# Patient Record
Sex: Male | Born: 1950
Health system: Southern US, Community
[De-identification: ages and names within clinical notes are randomized; demographics above are authoritative.]

## PROBLEM LIST (undated history)

## (undated) DIAGNOSIS — M1990 Unspecified osteoarthritis, unspecified site: Secondary | ICD-10-CM

## (undated) DIAGNOSIS — R0600 Dyspnea, unspecified: Secondary | ICD-10-CM

## (undated) DIAGNOSIS — I10 Essential (primary) hypertension: Secondary | ICD-10-CM

## (undated) DIAGNOSIS — T4145XA Adverse effect of unspecified anesthetic, initial encounter: Secondary | ICD-10-CM

## (undated) DIAGNOSIS — E785 Hyperlipidemia, unspecified: Secondary | ICD-10-CM

## (undated) DIAGNOSIS — N400 Enlarged prostate without lower urinary tract symptoms: Secondary | ICD-10-CM

## (undated) DIAGNOSIS — T8859XA Other complications of anesthesia, initial encounter: Secondary | ICD-10-CM

## (undated) DIAGNOSIS — C801 Malignant (primary) neoplasm, unspecified: Secondary | ICD-10-CM

## (undated) HISTORY — PX: RETINAL DETACHMENT SURGERY: SHX105

## (undated) HISTORY — PX: EYE SURGERY: SHX253

## (undated) HISTORY — DX: Hyperlipidemia, unspecified: E78.5

## (undated) HISTORY — PX: VASECTOMY: SHX75

## (undated) HISTORY — DX: Malignant (primary) neoplasm, unspecified: C80.1

## (undated) HISTORY — DX: Unspecified osteoarthritis, unspecified site: M19.90

## (undated) HISTORY — PX: CATARACT EXTRACTION W/ INTRAOCULAR LENS  IMPLANT, BILATERAL: SHX1307

## (undated) HISTORY — DX: Essential (primary) hypertension: I10

## (undated) SURGERY — Surgical Case
Anesthesia: *Unknown

---

## 2004-08-07 DIAGNOSIS — C801 Malignant (primary) neoplasm, unspecified: Secondary | ICD-10-CM

## 2004-08-07 HISTORY — DX: Malignant (primary) neoplasm, unspecified: C80.1

## 2005-07-11 ENCOUNTER — Ambulatory Visit (HOSPITAL_COMMUNITY): Admission: RE | Admit: 2005-07-11 | Discharge: 2005-07-12 | Payer: Self-pay | Admitting: Ophthalmology

## 2006-08-07 HISTORY — PX: COLONOSCOPY: SHX174

## 2006-11-19 ENCOUNTER — Ambulatory Visit: Payer: Self-pay | Admitting: Unknown Physician Specialty

## 2006-11-19 LAB — HM COLONOSCOPY

## 2007-07-08 ENCOUNTER — Ambulatory Visit: Payer: Self-pay | Admitting: Family Medicine

## 2007-07-09 ENCOUNTER — Ambulatory Visit (HOSPITAL_COMMUNITY): Admission: RE | Admit: 2007-07-09 | Discharge: 2007-07-10 | Payer: Self-pay | Admitting: Ophthalmology

## 2008-05-29 ENCOUNTER — Ambulatory Visit: Payer: Self-pay | Admitting: Sports Medicine

## 2008-06-16 ENCOUNTER — Ambulatory Visit: Payer: Self-pay | Admitting: Family Medicine

## 2008-08-27 DIAGNOSIS — Z86018 Personal history of other benign neoplasm: Secondary | ICD-10-CM

## 2008-08-27 HISTORY — DX: Personal history of other benign neoplasm: Z86.018

## 2010-09-17 IMAGING — CR RIGHT ELBOW - COMPLETE 3+ VIEW
1 series · 4 of 4 positions shown · non-contrast
Comparison: none

REASON FOR EXAM: Pain
COMMENTS:

PROCEDURE:     KDR - KDXR ELBOW RT COMP W/OBLIQUES  - June 16, 2008 [DATE]
RESULT:     No fracture, dislocation or other acute bony abnormality is
identified. No erosive arthritic changes are identified. No soft tissue
calcification is seen.

[Series 2: view not recorded · 0.17mm/px · 4 of 4 slices shown]
[im 1/4]
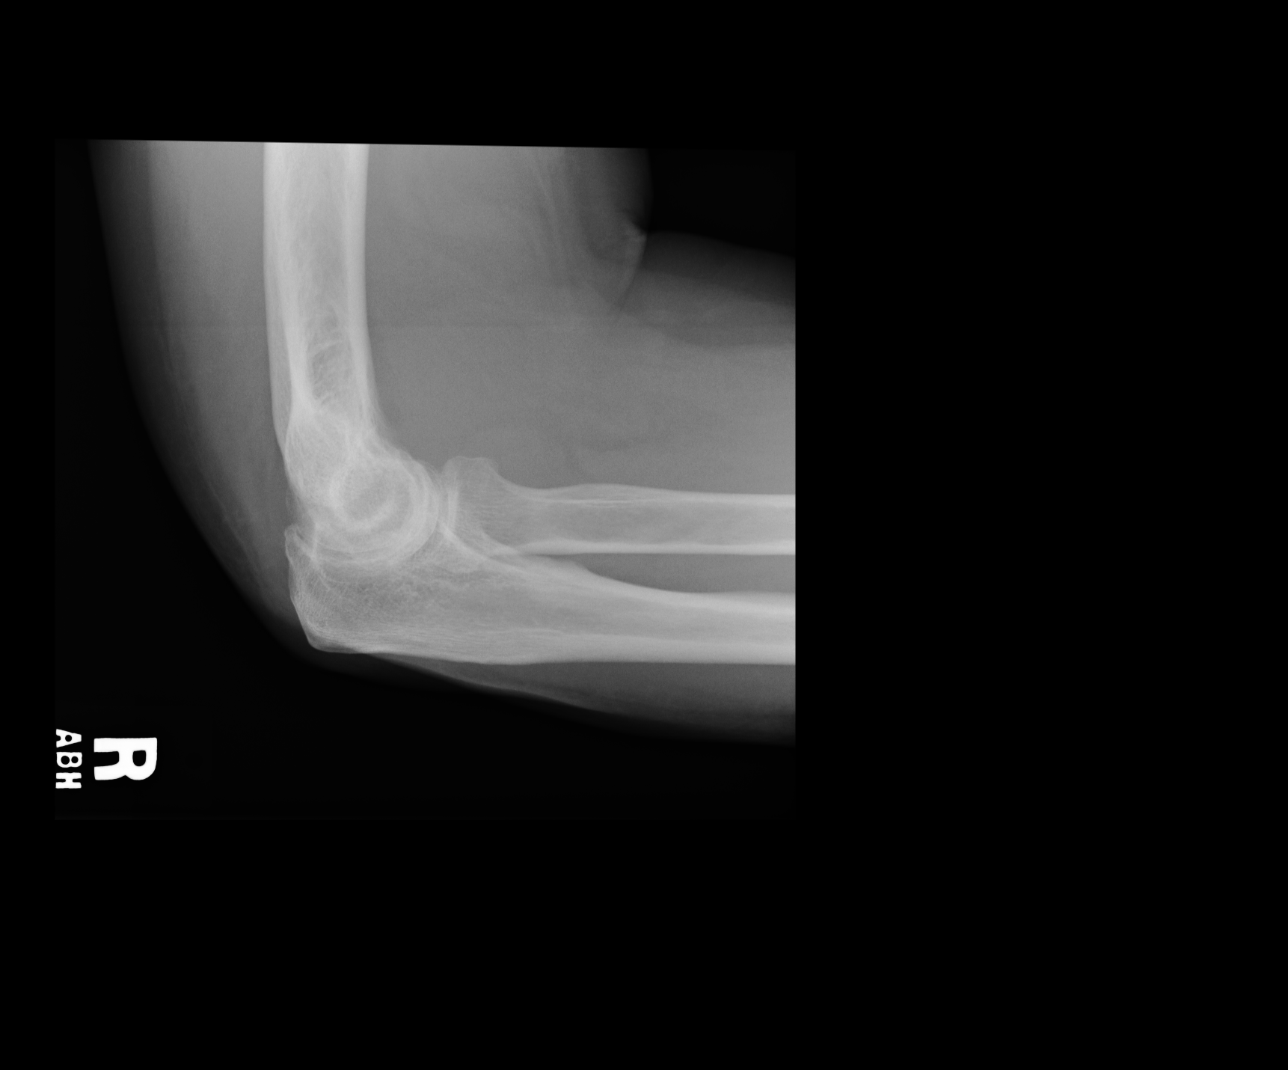
[im 2/4]
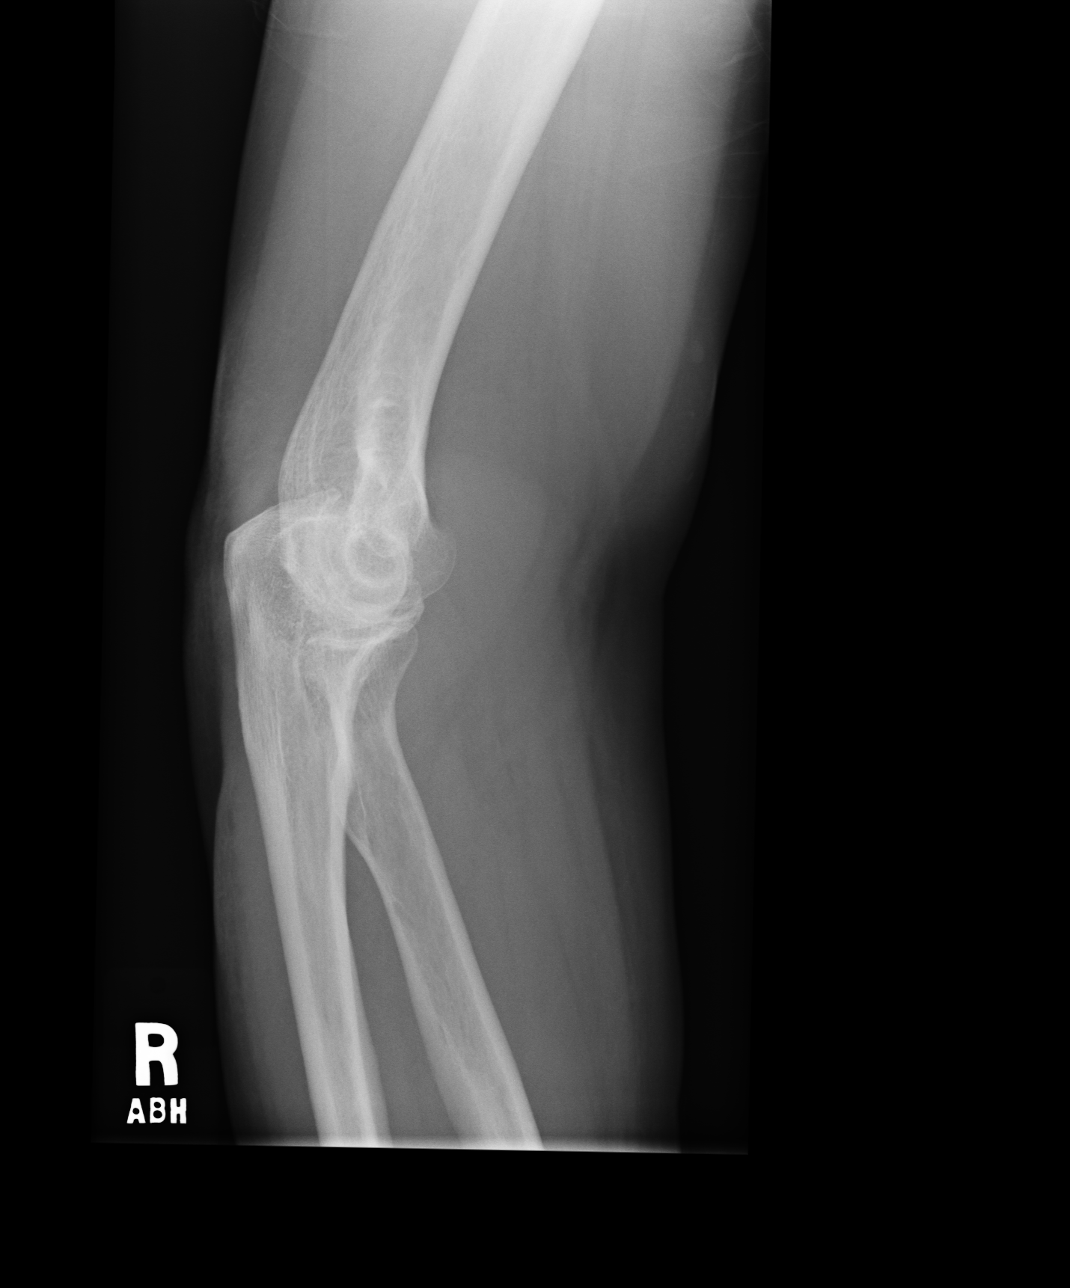
[im 3/4]
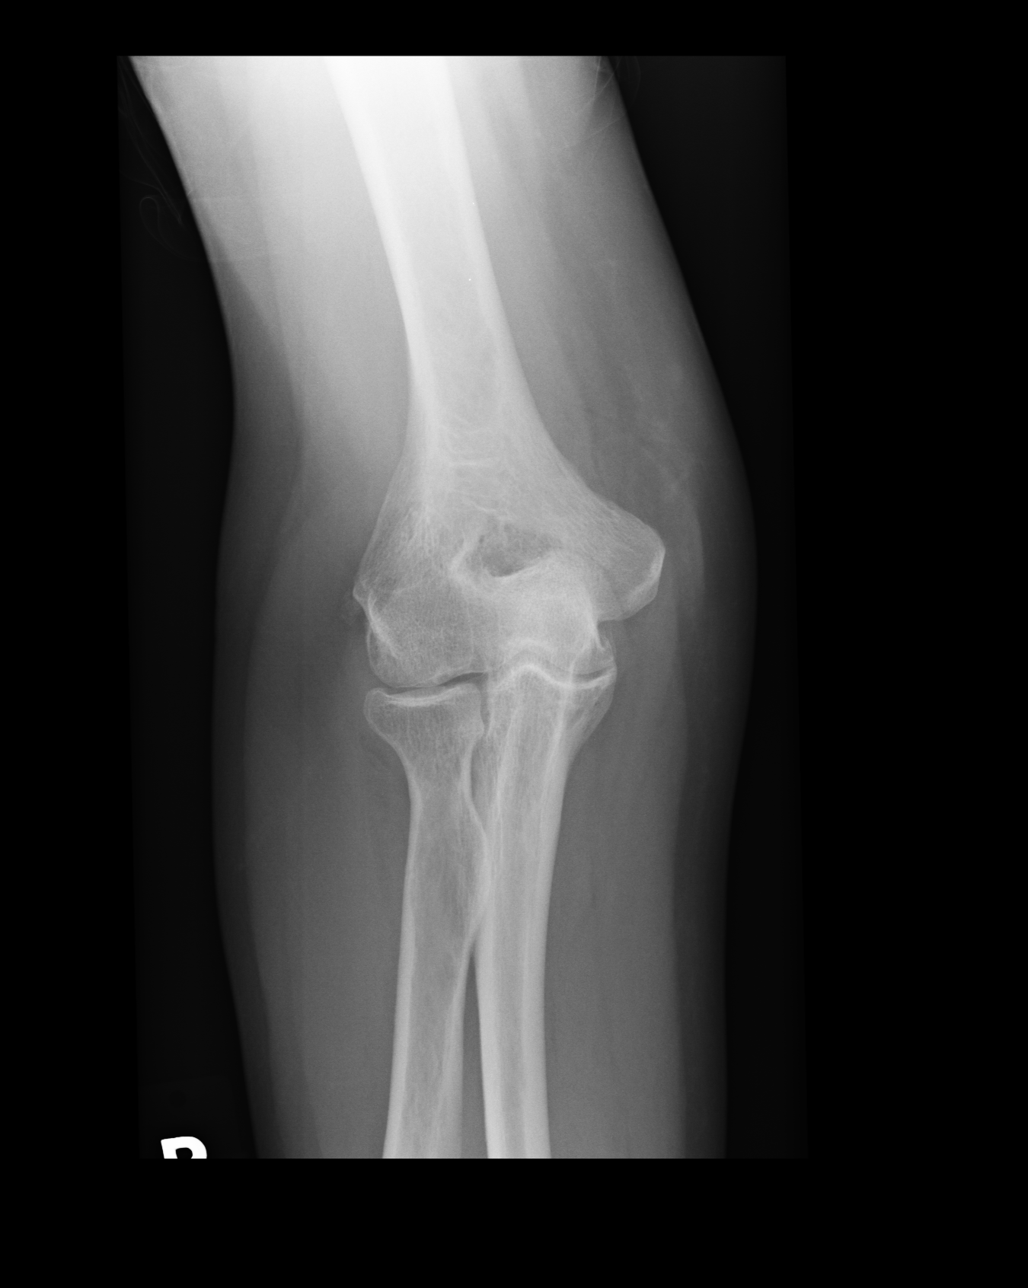
[im 4/4]
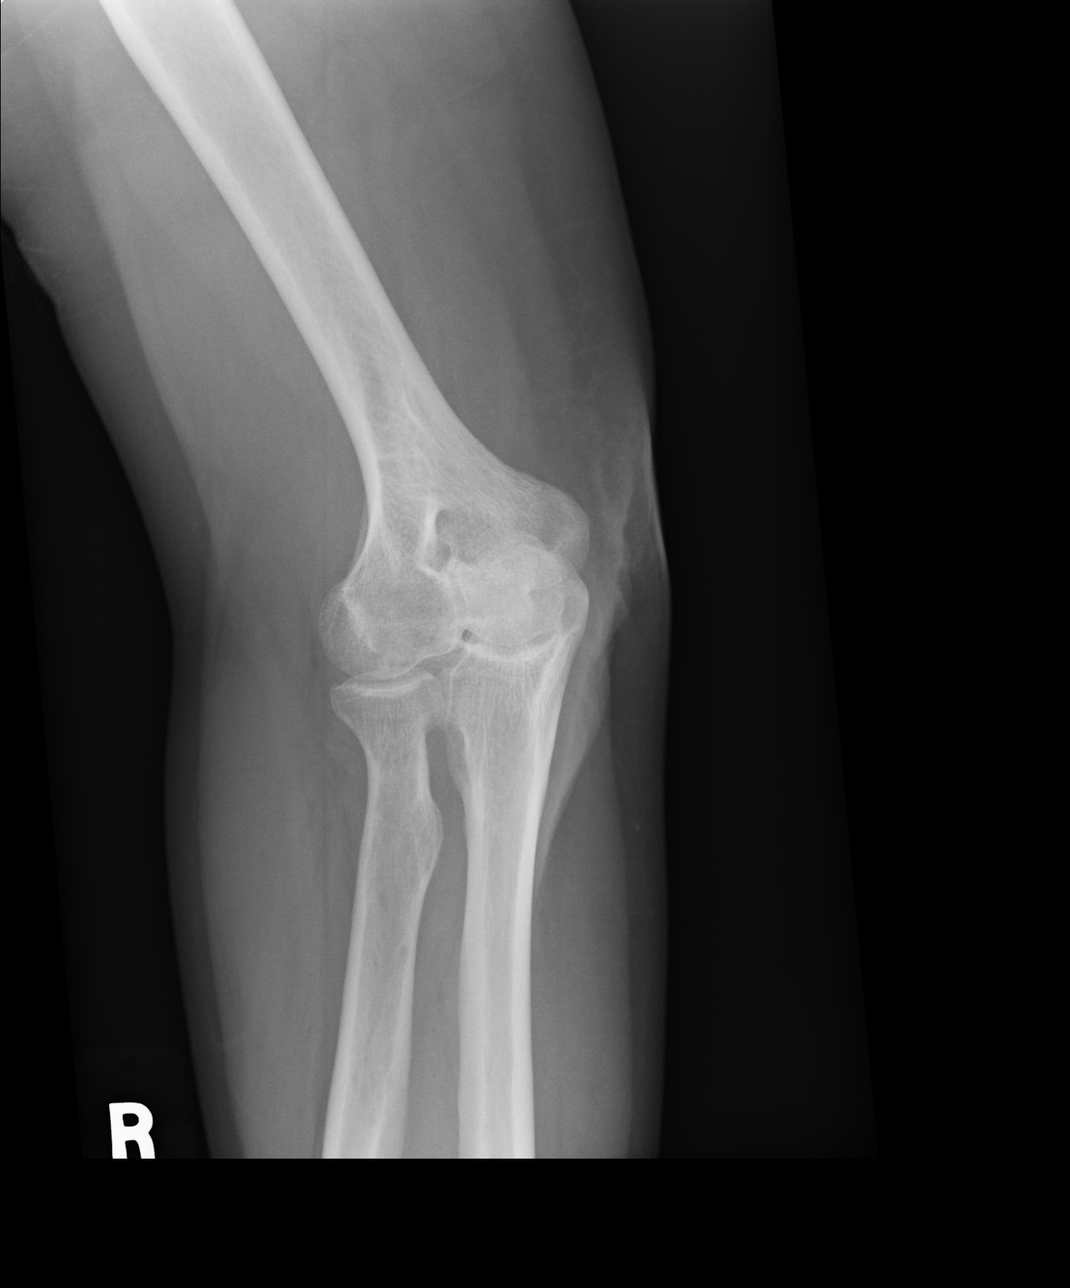

[4 of 4 positions shown; findings below may reference images not displayed]

IMPRESSION: No significant abnormalities are identified on plain film
examination.

## 2010-12-20 NOTE — Op Note (Signed)
NAME:  Wayne Goodwin, Wayne Goodwin                 ACCOUNT NO.:  [id number]   MEDICAL RECORD NO.:  [id number]          PATIENT TYPE:  AMB   LOCATION:  SDS                          FACILITY:  [facility]   PHYSICIAN:  John D. Ashley Royalty, M.D. DATE OF BIRTH:  [birth year]   DATE OF PROCEDURE:  07/09/2007  DATE OF DISCHARGE:                               OPERATIVE REPORT   ADMISSION DIAGNOSIS:  Rhegmatogenous retinal detachment, left eye.   PROCEDURE:  Scleral buckle left eye, gas injection left eye, retinal  photocoagulation left eye.   SURGEON:  Joshue Mulder, M.D.   ASSISTANT:  Bryan Lemma. Lundquist, P.A.   ANESTHESIA:  General.   DETAILS:  Usual prep and drape, 360 degrees limbal peritomy, isolation  of four rectus muscles on 2-0 silk.  Localization of break at 3 o'clock.  Scleral dissection for 360 degrees to admit number 279 intrascleral  implant.  Diathermy placed in the bed.  The radial 508G segment was  placed at 3 o'clock beneath the break.  240 band placed around the eye  with a 270 sleeve at 10 o'clock.  The perforation site was chosen at 3  o'clock in posterior aspect of the bed.  A slow controlled fluid egress  occurred.  The fluid was clear and colorless and thin.  The Perfluoron  propane 1 mL 100% was injected in the vitreous cavity rate to reinflate  the globe.  Two scleral sutures per quadrant for a total of eight  scleral sutures were placed in the scleral flaps.  The 508G segment was  placed beneath the break.  The buckle was trimmed and adjusted.  The  band was trimmed and adjusted.  Indirect ophthalmoscopy showed the  retina be lying nicely in place on the scleral buckle with the retinal  break well-supported.  The indirect ophthalmoscope laser was moved into  place, 1383 burns placed around the retinal periphery with a power of  700 milliwatts, 1000 microns each and 0.1 seconds each.  The scleral  sutures were knotted and the free ends removed.  The conjunctiva was  reapproximated with  suture.  Polymyxin and gentamicin were irrigated  into Tenon's space.  Atropine solution was applied.  Marcaine was  infiltrated injected around the globe for postop pain. Decadron 10 mg  was injected to the lower subconjunctival space.  Closing pressure was  15 with a Barraquer tonometer.  TobraDex ophthalmic ointment, patch and  shield were placed.  The patient was awakened, taken to recovery in  satisfactory condition.   COMPLICATIONS:  None.   DURATION:  2 hours.      Beulah Gandy. Ashley Royalty, M.D.  Electronically Signed     JDM/MEDQ  D:  07/09/2007  T:  07/09/2007  Job:  [id number]

## 2010-12-23 NOTE — Op Note (Signed)
NAME:  Pietrzak, Afnan                 ACCOUNT NO.:  [id number]   MEDICAL RECORD NO.:  [id number]          PATIENT TYPE:  AMB   LOCATION:  SDS                          FACILITY:  [facility]   PHYSICIAN:  John D. Ashley Royalty, M.D. DATE OF BIRTH:  May 22, 1951   DATE OF PROCEDURE:  07/11/2005  DATE OF DISCHARGE:                                 OPERATIVE REPORT   ADMISSION DIAGNOSIS:  Rhegmatogenous retinal detachment with multiple large  breaks, right eye.   PROCEDURE:  Scleral buckle, pars plana vitrectomy, retinal photocoagulation,  gas-fluid exchange, Perfluoron injection, perfluoropropane injection on the  right eye.   SURGEON:  Trevionne Mulder, MD   ASSISTANT:  Rosalie Doctor, MA   ANESTHESIA:  General.   DETAILS:  Usual prep and drape, 360-degree limbal peritomy, isolation of  four rectus muscles on 2-0 silk, localization of breaks superiorly and  inferiorly, scleral dissection for 360 degrees to admit a number 279  intrascleral implant.  Additional posterior dissection was carried out at 8  o'clock to admit a number 508G radial segment.  A 240 band was placed around  the eye with a 270 sleeve at 3 o'clock.  Once the buckle was placed, a  perforation was made at 2 o'clock and a large amount of clear colorless  subretinal fluid came forth.  The decision was made for a vitrectomy.  The 5-  mm infusion port was placed at 8 o'clock.  The lighted pick and the cutter  were placed at 10 and 2 o'clock, respectively.  The biome viewing system was  used and Provisc on the corneal surface.  Pars plana vitrectomy was begun  just behind the crystalline lens.  Vitreous blood, vitreous debris, vitreous  pigment and vitreous strands were encountered.  These were carefully removed  under low suction and rapid cutting for 360 degrees.  The break at 8 o'clock  was seen and the anterior flap was removed.  Several lattice patches with  tears in them were seen at 10 o'clock and 11 o'clock, and 2 large breaks  were seen in the 11, 11:30 and 1 o'clock.  Perfluoron was injected to  reattach the retina.  Endolaser was performed, once the Perfluoron was in  place.  Gas was exchanged for the Perfluoron and a full reattachment of the  peripheral retina was obtained with minimal balloon of subretinal fluid  superiorly in the posterior segment.  The indirect ophthalmoscope laser was  moved into place and laser marks were placed around the retinal periphery;  the total number was 1368 burns with a power of [id number] milliwatts, 1000  microns each and 0.1 seconds each.  A Perfluoropropane gas 16%/gas exchange  was performed.  The instruments were removed from the eye and 9-0 nylon was  used to close.  The sclerotomy sites were tested and found to be tight.  The  buckle was adjusted and trimmed.  The band was adjusted and trimmed.  Scleral sutures were knotted and the free ends removed.  The conjunctiva was  reposited with 7-0 chromic suture.  Polymyxin and gentamicin were irrigated  into Tenon's space.  Atropine solution was applied.  Marcaine was injected  around the globe for postop  pain.  TobraDex ophthalmic ointment, a patch and shield were placed.  The  closing pressure was 10 with a Barraquer keratometer.  Complications --  none.  Duration -- 2-1/2 hours.  TobraDex, a patch and shield were placed.  The patient was awakened and taken to Recovery in satisfactory condition.      Wayne Goodwin. Ashley Royalty, M.D.  Electronically Signed     JDM/MEDQ  D:  07/11/2005  T:  07/12/2005  Job:  [id number]

## 2011-05-15 LAB — CBC
HCT: 35.4 — ABNORMAL LOW
MCHC: 35.2
MCV: 92.9
Platelets: 224
WBC: 9.1

## 2011-05-15 LAB — BASIC METABOLIC PANEL
BUN: 29 — ABNORMAL HIGH
CO2: 24
Chloride: 102
Potassium: 4.2

## 2011-08-08 HISTORY — PX: COLONOSCOPY W/ BIOPSIES: SHX1374

## 2011-10-31 ENCOUNTER — Ambulatory Visit (INDEPENDENT_AMBULATORY_CARE_PROVIDER_SITE_OTHER): Payer: PRIVATE HEALTH INSURANCE | Admitting: Ophthalmology

## 2011-10-31 DIAGNOSIS — H35039 Hypertensive retinopathy, unspecified eye: Secondary | ICD-10-CM

## 2011-10-31 DIAGNOSIS — H2700 Aphakia, unspecified eye: Secondary | ICD-10-CM

## 2011-10-31 DIAGNOSIS — H43819 Vitreous degeneration, unspecified eye: Secondary | ICD-10-CM

## 2011-10-31 DIAGNOSIS — I10 Essential (primary) hypertension: Secondary | ICD-10-CM

## 2011-10-31 DIAGNOSIS — H33009 Unspecified retinal detachment with retinal break, unspecified eye: Secondary | ICD-10-CM

## 2011-11-14 ENCOUNTER — Ambulatory Visit: Payer: Self-pay | Admitting: Unknown Physician Specialty

## 2012-02-15 ENCOUNTER — Ambulatory Visit: Payer: Self-pay | Admitting: Unknown Physician Specialty

## 2012-10-30 ENCOUNTER — Ambulatory Visit (INDEPENDENT_AMBULATORY_CARE_PROVIDER_SITE_OTHER): Payer: PRIVATE HEALTH INSURANCE | Admitting: Ophthalmology

## 2012-12-06 ENCOUNTER — Ambulatory Visit (INDEPENDENT_AMBULATORY_CARE_PROVIDER_SITE_OTHER): Payer: 59 | Admitting: Ophthalmology

## 2012-12-06 DIAGNOSIS — I10 Essential (primary) hypertension: Secondary | ICD-10-CM

## 2012-12-06 DIAGNOSIS — H43819 Vitreous degeneration, unspecified eye: Secondary | ICD-10-CM

## 2012-12-06 DIAGNOSIS — H35379 Puckering of macula, unspecified eye: Secondary | ICD-10-CM

## 2012-12-06 DIAGNOSIS — H35039 Hypertensive retinopathy, unspecified eye: Secondary | ICD-10-CM

## 2012-12-06 DIAGNOSIS — H33009 Unspecified retinal detachment with retinal break, unspecified eye: Secondary | ICD-10-CM

## 2013-08-07 HISTORY — PX: INSERTION PROSTATE RADIATION SEED: SUR718

## 2013-11-14 DIAGNOSIS — R972 Elevated prostate specific antigen [PSA]: Secondary | ICD-10-CM | POA: Insufficient documentation

## 2013-11-14 DIAGNOSIS — N401 Enlarged prostate with lower urinary tract symptoms: Secondary | ICD-10-CM

## 2013-11-14 DIAGNOSIS — N138 Other obstructive and reflux uropathy: Secondary | ICD-10-CM | POA: Insufficient documentation

## 2013-12-10 ENCOUNTER — Ambulatory Visit (INDEPENDENT_AMBULATORY_CARE_PROVIDER_SITE_OTHER): Payer: 59 | Admitting: Ophthalmology

## 2013-12-10 DIAGNOSIS — H35349 Macular cyst, hole, or pseudohole, unspecified eye: Secondary | ICD-10-CM

## 2013-12-10 DIAGNOSIS — H43819 Vitreous degeneration, unspecified eye: Secondary | ICD-10-CM

## 2013-12-10 DIAGNOSIS — H33009 Unspecified retinal detachment with retinal break, unspecified eye: Secondary | ICD-10-CM

## 2013-12-10 DIAGNOSIS — H35039 Hypertensive retinopathy, unspecified eye: Secondary | ICD-10-CM

## 2013-12-10 DIAGNOSIS — I10 Essential (primary) hypertension: Secondary | ICD-10-CM

## 2014-01-30 DIAGNOSIS — E669 Obesity, unspecified: Secondary | ICD-10-CM | POA: Insufficient documentation

## 2014-02-14 IMAGING — US ABDOMEN ULTRASOUND LIMITED
1 series · 17 of 25 positions shown · non-contrast
Comparison: none

REASON FOR EXAM: elevated transaminase
COMMENTS:

[Series 1: abdomen ultrasound limited · 17 of 83 slices shown]
[im 1/83]
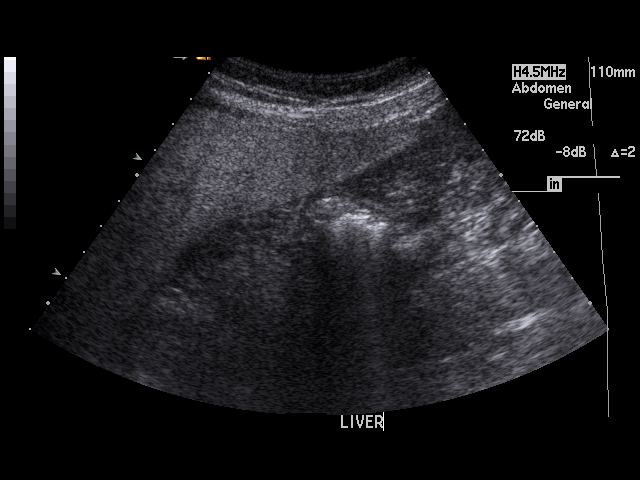
[im 7/83]
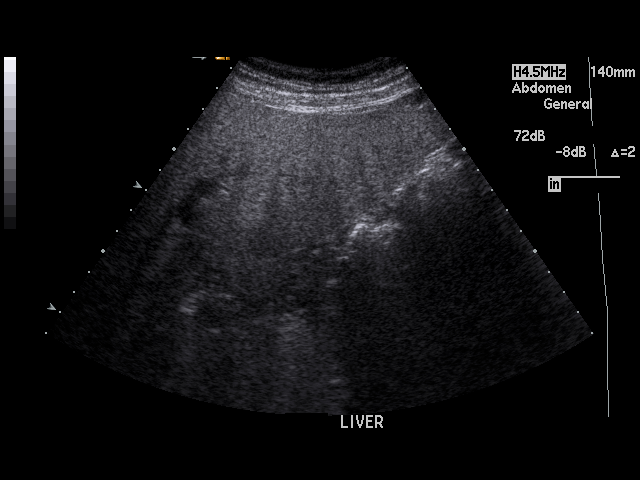
[im 11/83]
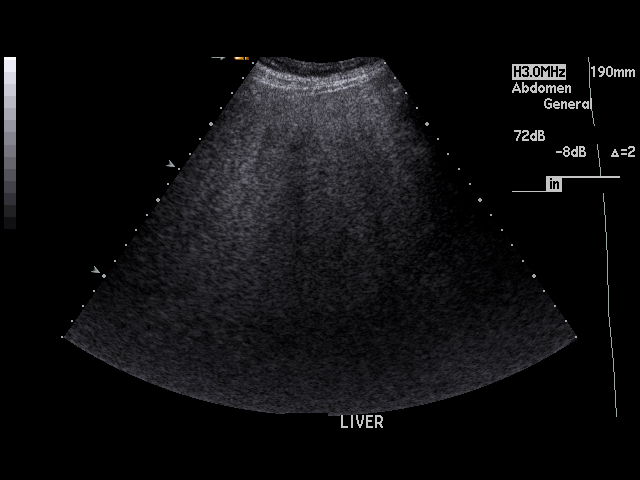
[im 18/83]
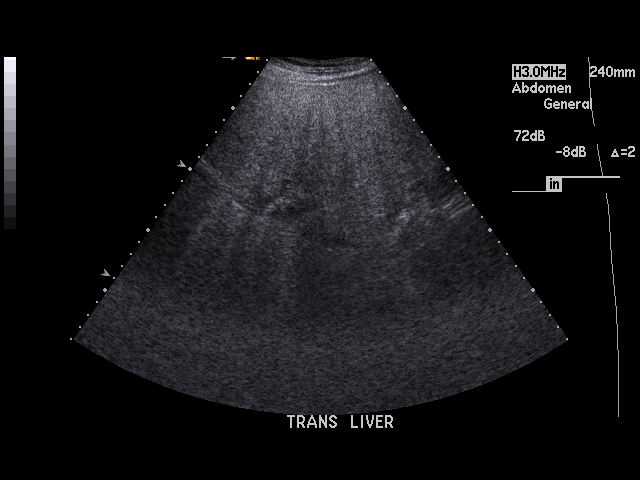
[im 21/83]
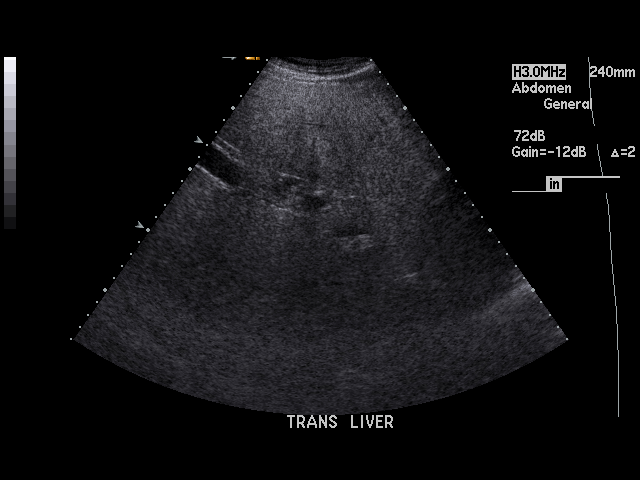
[im 28/83]
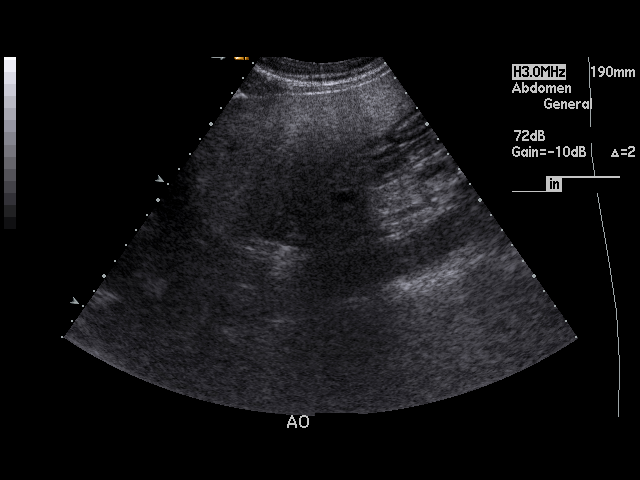
[im 31/83]
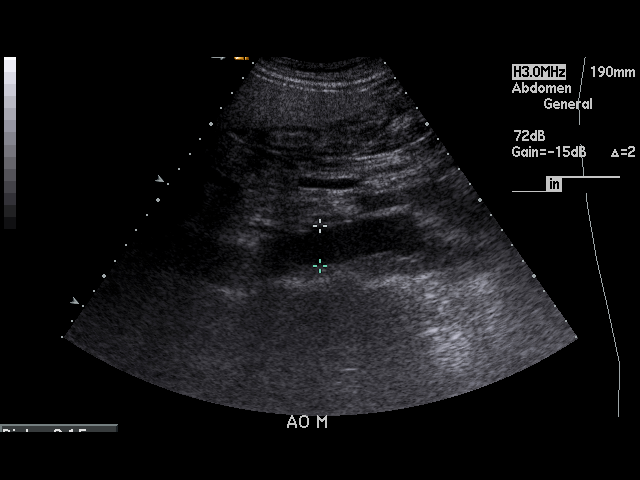
[im 38/83]
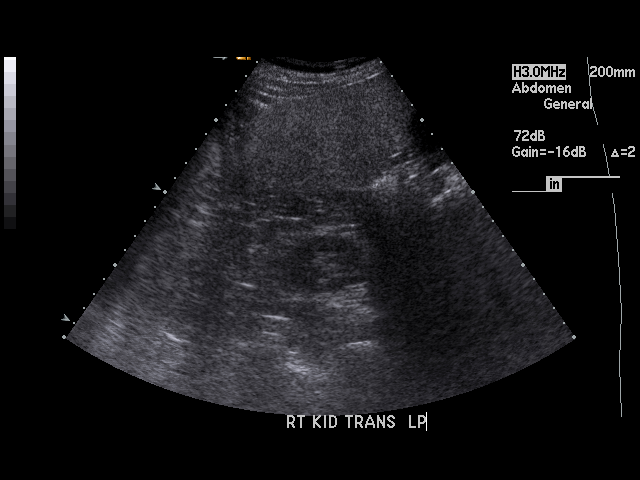
[im 42/83]
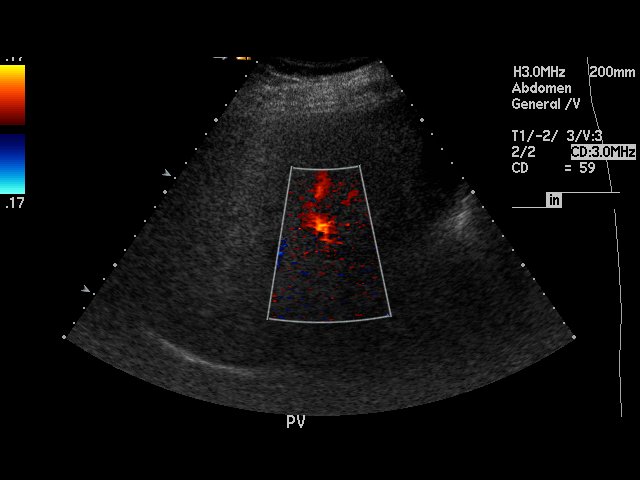
[im 45/83]
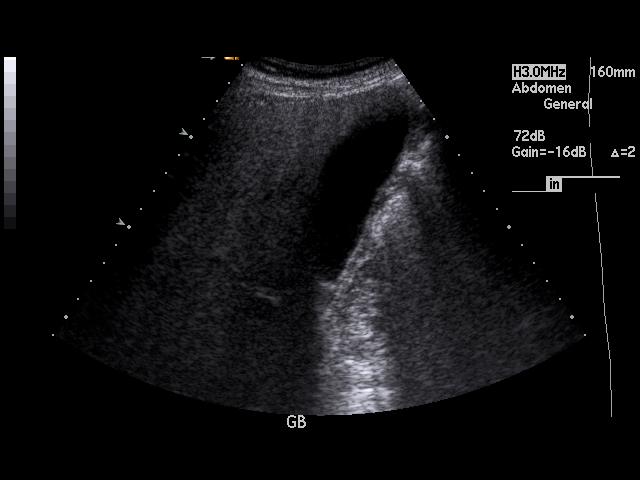
[im 52/83]
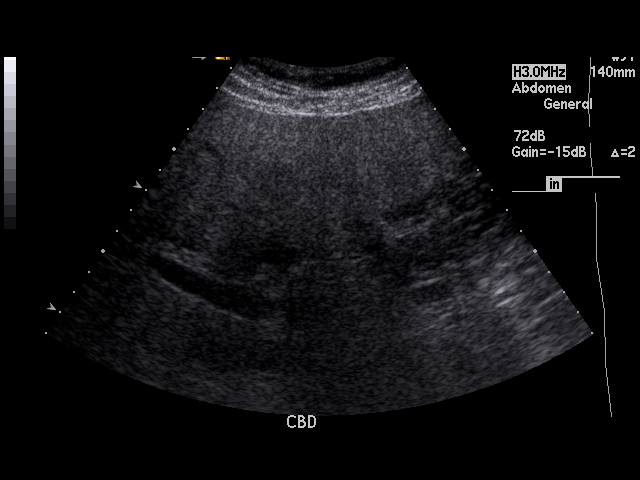
[im 55/83]
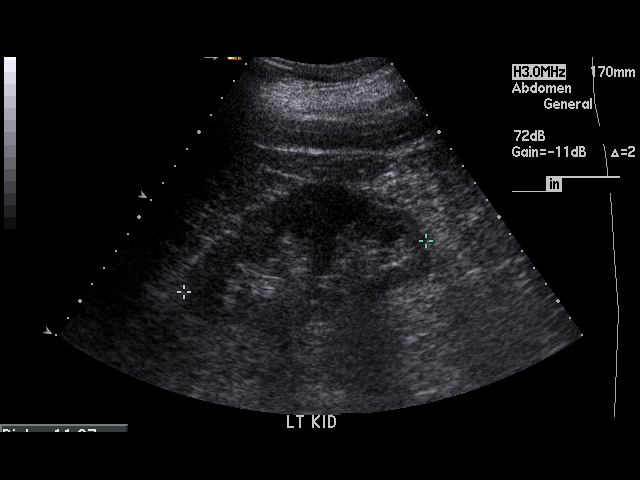
[im 62/83]
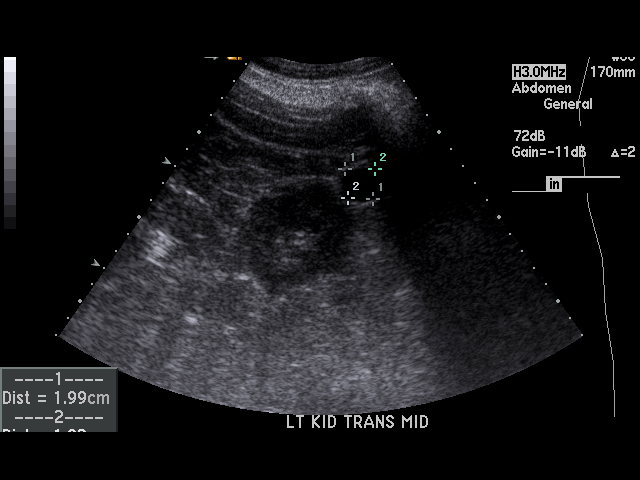
[im 65/83]
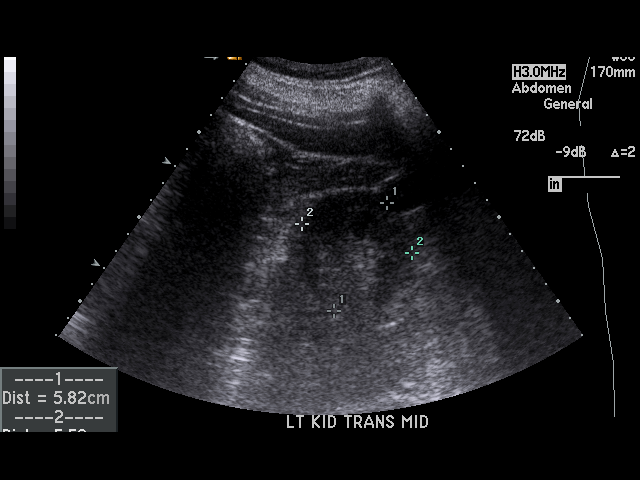
[im 72/83]
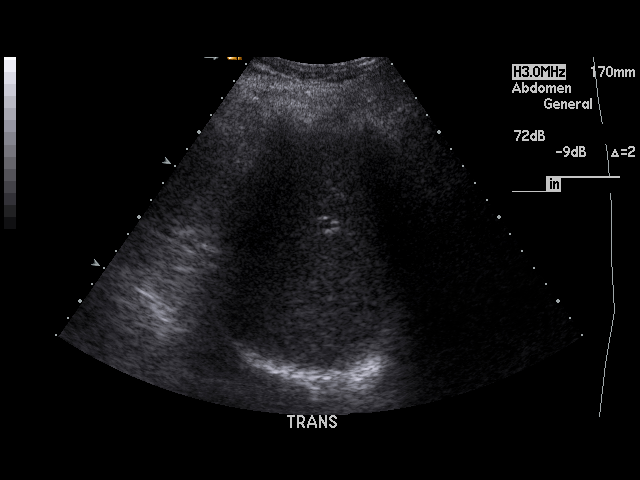
[im 76/83]
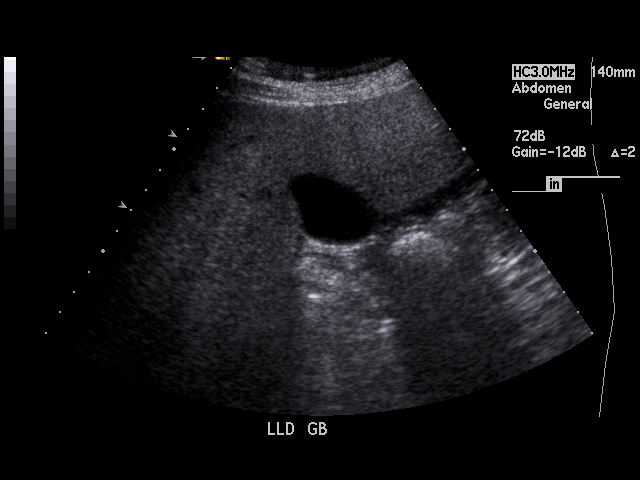
[im 83/83]
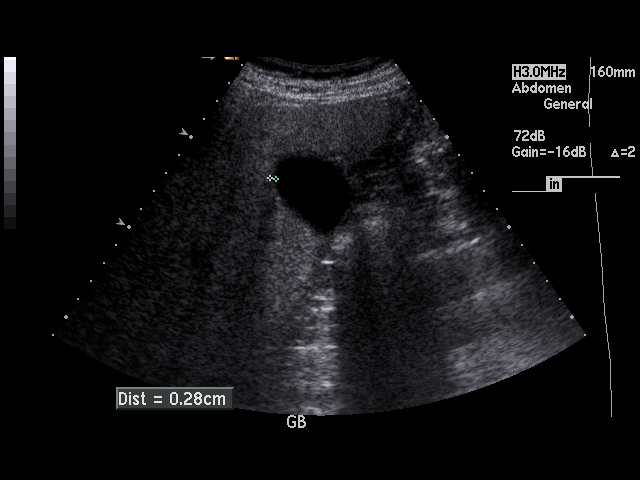

[17 of 25 positions shown; findings below may reference images not displayed]

PROCEDURE:     SUNGAT - SUNGAT ABDOMEN UPPER GENERAL  - November 14, 2011  [DATE]

RESULT:     The liver is hyperechogenic consistent with fatty infiltration.
No focal hepatic mass is seen. Spleen size is within the limits of normal.
The spleen measures 12.8 cm at maximum AP diameter. The body of the pancreas
is visualized and is normal in appearance. The head and tail are partially
obscured. The abdominal aorta and inferior vena cava show no significant
abnormalities. No gallstones are seen. There is no thickening of the
gallbladder wall. The common bile duct measures 2.9 mm in diameter, which is
within normal limits. The kidneys show no hydronephrosis. Sagittally, the
right kidney measures 11.9 cm and the left measures 12.0 cm. There is
incidentally noted a 2 cm cyst of the left kidney. No ascites is seen.
IMPRESSION: 1. Probable fatty infiltration of the liver.
2. No gallstones or other acute change is identified.
3. Incidental note is made of a small left renal cyst.

## 2014-07-29 ENCOUNTER — Ambulatory Visit (INDEPENDENT_AMBULATORY_CARE_PROVIDER_SITE_OTHER): Payer: Managed Care, Other (non HMO) | Admitting: Internal Medicine

## 2014-07-29 DIAGNOSIS — Z9189 Other specified personal risk factors, not elsewhere classified: Secondary | ICD-10-CM

## 2014-07-29 DIAGNOSIS — E785 Hyperlipidemia, unspecified: Secondary | ICD-10-CM

## 2014-07-29 DIAGNOSIS — Z23 Encounter for immunization: Secondary | ICD-10-CM

## 2014-07-29 DIAGNOSIS — C61 Malignant neoplasm of prostate: Secondary | ICD-10-CM | POA: Insufficient documentation

## 2014-07-29 DIAGNOSIS — I10 Essential (primary) hypertension: Secondary | ICD-10-CM

## 2014-07-29 MED ORDER — CIPROFLOXACIN HCL 500 MG PO TABS: 500.0000 mg | ORAL_TABLET | Freq: Two times a day (BID) | ORAL | Status: DC

## 2014-07-29 NOTE — Progress Notes (Signed)
   Subjective:    Patient ID: Wayne Goodwin, male    DOB: 02-20-1951, 63 y.o.   MRN: [id number]  HPI Wayne Goodwin and his wife will be going on a group tour to [place], which includes 2 safari in [place], and [place]. Their itinerary includes: leaving Jan 9th thru [place] in [place] on Jan 10th, spend 3 nights at [place]. Leave for safari on Jan 13th for 3 nights at [employer], then go to [place] on jan 16th for 4 nights at [employer]. They will fly back to [place] on jan 17th, for a few days, then leave on jan 20th to [place] for 2 days. They fly out on Jan 22nd and return to the [place] on Jan 23rd.  He has prior hx of getting twinrix hep A/B, no flu this year, but had it last year. Her pcp gave her rx for malarone  All: NKMA  MEDS: ambien, tamsulosin, crestor, flomax, amlodipine  Pmhx: prostate ca, hx of detached retina, hypertension  Prior travel to [place] and [place]     Review of Systems     Objective:   Physical Exam        Assessment & Plan:  Travel vaccination = will give hep A #2, typhoid vaccination, tdap. Offer to get flu at local pharmacy  Malaria proph = gave instructions on using deet spray and premethrin. Already has rx for malarone  Traveler's diarrhea= gave rx for cipro if needed

## 2014-07-29 NOTE — Addendum Note (Signed)
Addended by: Landis Gandy on: 07/29/2014 05:00 PM   Modules accepted: Orders

## 2014-11-17 LAB — HEPATIC FUNCTION PANEL
ALK PHOS: 60 U/L (ref 25–125)
ALT: 40 U/L (ref 10–40)
AST: 31 U/L (ref 14–40)
BILIRUBIN, TOTAL: 0.7 mg/dL

## 2014-11-17 LAB — CBC AND DIFFERENTIAL
HCT: 43 % (ref 41–53)
Hemoglobin: 14.7 g/dL (ref 13.5–17.5)
NEUTROS ABS: 6 /uL
PLATELETS: 187 10*3/uL (ref 150–399)
WBC: 9.1 10^3/mL

## 2014-11-17 LAB — LIPID PANEL
Cholesterol: 163 mg/dL (ref 0–200)
HDL: 41 mg/dL (ref 35–70)
LDL CALC: 70 mg/dL
LDl/HDL Ratio: 1.7
Triglycerides: 261 mg/dL — AB (ref 40–160)

## 2014-11-17 LAB — BASIC METABOLIC PANEL
BUN: 22 mg/dL — AB (ref 4–21)
CREATININE: 1 mg/dL (ref 0.6–1.3)
GLUCOSE: 117 mg/dL
Potassium: 4.2 mmol/L (ref 3.4–5.3)
Sodium: 142 mmol/L (ref 137–147)

## 2014-11-17 LAB — TSH: TSH: 1.8 u[IU]/mL (ref 0.41–5.90)

## 2014-12-15 ENCOUNTER — Ambulatory Visit (INDEPENDENT_AMBULATORY_CARE_PROVIDER_SITE_OTHER): Payer: BLUE CROSS/BLUE SHIELD | Admitting: Ophthalmology

## 2014-12-15 DIAGNOSIS — H35033 Hypertensive retinopathy, bilateral: Secondary | ICD-10-CM

## 2014-12-15 DIAGNOSIS — I10 Essential (primary) hypertension: Secondary | ICD-10-CM

## 2014-12-15 DIAGNOSIS — H35372 Puckering of macula, left eye: Secondary | ICD-10-CM | POA: Diagnosis not present

## 2014-12-15 DIAGNOSIS — H43813 Vitreous degeneration, bilateral: Secondary | ICD-10-CM | POA: Diagnosis not present

## 2014-12-15 DIAGNOSIS — H338 Other retinal detachments: Secondary | ICD-10-CM

## 2015-01-25 ENCOUNTER — Other Ambulatory Visit: Payer: Self-pay | Admitting: Family Medicine

## 2015-01-26 ENCOUNTER — Other Ambulatory Visit: Payer: Self-pay

## 2015-01-26 DIAGNOSIS — G4700 Insomnia, unspecified: Secondary | ICD-10-CM

## 2015-01-26 MED ORDER — ZOLPIDEM TARTRATE 5 MG PO TABS: 5.0000 mg | ORAL_TABLET | Freq: Every day | ORAL | Status: DC

## 2015-01-26 NOTE — Telephone Encounter (Signed)
I think you have this request in your box

## 2015-07-13 ENCOUNTER — Other Ambulatory Visit: Payer: Self-pay | Admitting: Family Medicine

## 2015-07-14 ENCOUNTER — Ambulatory Visit (INDEPENDENT_AMBULATORY_CARE_PROVIDER_SITE_OTHER): Payer: BLUE CROSS/BLUE SHIELD | Admitting: Family Medicine

## 2015-07-14 ENCOUNTER — Encounter: Payer: Self-pay | Admitting: Family Medicine

## 2015-07-14 VITALS — BP 126/78 | HR 92 | Temp 97.8°F | Resp 16 | Wt 277.6 lb

## 2015-07-14 DIAGNOSIS — H3320 Serous retinal detachment, unspecified eye: Secondary | ICD-10-CM | POA: Insufficient documentation

## 2015-07-14 DIAGNOSIS — E7800 Pure hypercholesterolemia, unspecified: Secondary | ICD-10-CM | POA: Insufficient documentation

## 2015-07-14 DIAGNOSIS — J018 Other acute sinusitis: Secondary | ICD-10-CM

## 2015-07-14 DIAGNOSIS — R05 Cough: Secondary | ICD-10-CM | POA: Diagnosis not present

## 2015-07-14 DIAGNOSIS — M1990 Unspecified osteoarthritis, unspecified site: Secondary | ICD-10-CM

## 2015-07-14 DIAGNOSIS — I10 Essential (primary) hypertension: Secondary | ICD-10-CM

## 2015-07-14 DIAGNOSIS — G4700 Insomnia, unspecified: Secondary | ICD-10-CM | POA: Diagnosis not present

## 2015-07-14 DIAGNOSIS — M109 Gout, unspecified: Secondary | ICD-10-CM | POA: Insufficient documentation

## 2015-07-14 DIAGNOSIS — Q792 Exomphalos: Secondary | ICD-10-CM | POA: Insufficient documentation

## 2015-07-14 DIAGNOSIS — C61 Malignant neoplasm of prostate: Secondary | ICD-10-CM | POA: Insufficient documentation

## 2015-07-14 DIAGNOSIS — M1909 Primary osteoarthritis, other specified site: Secondary | ICD-10-CM | POA: Insufficient documentation

## 2015-07-14 DIAGNOSIS — R059 Cough, unspecified: Secondary | ICD-10-CM

## 2015-07-14 MED ORDER — AMOXICILLIN-POT CLAVULANATE 875-125 MG PO TABS: 1.0000 | ORAL_TABLET | Freq: Two times a day (BID) | ORAL | Status: DC

## 2015-07-14 MED ORDER — HYDROCODONE-HOMATROPINE 5-1.5 MG/5ML PO SYRP: 5.0000 mL | ORAL_SOLUTION | Freq: Three times a day (TID) | ORAL | Status: DC | PRN

## 2015-07-14 NOTE — Progress Notes (Signed)
Patient ID: Wayne Goodwin, male   DOB: 1950-10-30, 64 y.o.   MRN: [id number] Name: Wayne Goodwin   MRN: [id number]    DOB: [birth year]   Date:07/14/2015       Progress Note  Subjective  Chief Complaint  Chief Complaint  Patient presents with  . URI    URI  This is a new problem. The current episode started 1 to 4 weeks ago. The problem has been gradually worsening. Associated symptoms include congestion and coughing. He has tried decongestant for the symptoms. The treatment provided moderate relief.  He has started to develop green mucus lately.   No problem-specific assessment & plan notes found for this encounter.   No past medical history on file.  Social History  Substance Use Topics  . Smoking status: Never Smoker   . Smokeless tobacco: Never Used  . Alcohol Use: 0.0 oz/week    0 Standard drinks or equivalent per week     Current outpatient prescriptions:  .  amLODipine-benazepril (LOTREL) 10-20 MG capsule, Take by mouth., Disp: , Rfl:  .  aspirin EC 81 MG tablet, Take 81 mg by mouth., Disp: , Rfl:  .  Psyllium 28.3 % POWD, Take by mouth., Disp: , Rfl:  .  rosuvastatin (CRESTOR) 10 MG tablet, Take 10 mg by mouth., Disp: , Rfl:  .  tamsulosin (FLOMAX) 0.4 MG CAPS capsule, Take 0.4 mg by mouth., Disp: , Rfl:  .  zolpidem (AMBIEN) 5 MG tablet, TAKE ONE TABLET AT BEDTIME, Disp: 30 tablet, Rfl: 5  No Known Allergies  Review of Systems  Constitutional: Negative.   HENT: Positive for congestion.   Eyes: Negative.   Respiratory: Positive for cough.   Cardiovascular: Negative.   Gastrointestinal: Negative.   Genitourinary: Negative.   Musculoskeletal: Negative.   Skin: Negative.   Neurological: Negative.   Endo/Heme/Allergies: Negative.   Psychiatric/Behavioral: Negative.       Objective  Filed Vitals:   07/14/15 0805  BP: 126/78  Pulse: 92  Temp: 97.8 F (36.6 C)  TempSrc: Oral  Resp: 16  Weight: 277 lb 9.6 oz (125.919 kg)  SpO2: 95%     Physical  Exam  Constitutional: He is oriented to person, place, and time and well-developed, well-nourished, and in no distress.  HENT:  Head: Normocephalic and atraumatic.  Mouth/Throat: Oropharynx is clear and moist.  Eyes: Conjunctivae are normal. Pupils are equal, round, and reactive to light.  Neck: Normal range of motion. Neck supple.  Cardiovascular: Normal rate, regular rhythm, normal heart sounds and intact distal pulses.   No murmur heard. Pulmonary/Chest: Effort normal and breath sounds normal. No respiratory distress. He has no wheezes. He has no rales.  Neurological: He is alert and oriented to person, place, and time. Gait normal.  Skin: Skin is warm and dry.  Psychiatric: Mood, memory, affect and judgment normal.        Assessment & Plan 1. Other acute sinusitis New. Rest. Push fluids. - amoxicillin-clavulanate (AUGMENTIN) 875-125 MG tablet; Take 1 tablet by mouth 2 (two) times daily.  Dispense: 20 tablet; Refill: 0 - HYDROcodone-homatropine (HYCODAN) 5-1.5 MG/5ML syrup; Take 5 mLs by mouth every 8 (eight) hours as needed for cough.  Dispense: 120 mL; Refill: 0  2. Cough - HYDROcodone-homatropine (HYCODAN) 5-1.5 MG/5ML syrup; Take 5 mLs by mouth every 8 (eight) hours as needed for cough.  Dispense: 120 mL; Refill: 0 3. Prostate cancer Followed at [facility]. Last PSA was 0  I have done the exam and reviewed  the above chart and it is accurate to the best of my knowledge.   Patient was seen and examined by Dr. Eulas Post and note was scribed by Theressa Millard, RMA.

## 2015-07-15 ENCOUNTER — Other Ambulatory Visit: Payer: Self-pay

## 2015-07-19 ENCOUNTER — Telehealth: Payer: Self-pay

## 2015-07-19 NOTE — Telephone Encounter (Signed)
With the rash spreading I would assume it might be from the antibiotics. Stop Augmentin. I would recommend Benadryl to help control the rash as long as it doesn't make him too sleepy. Hopefully will resolve in a couple of days. I don't think topicals will be much help with this.

## 2015-07-19 NOTE — Telephone Encounter (Signed)
Advised patient as below.  

## 2015-07-19 NOTE — Telephone Encounter (Signed)
Patient reports that he was placed on an abx for a sinus infection last week, and he has now developed a mild rash. He reports that it is mildly itchy, and it is located on both arms. He reports that his rash is starting to spread. Patient is wanting to know if he should continue abx (Augmentin) or should he just take Benadryl along with abx. Patient reports that his rash is tolerable, but he just wanted to be sure. Please advise. Thanks!

## 2015-07-19 NOTE — Telephone Encounter (Signed)
Left message to call back  

## 2015-07-30 ENCOUNTER — Other Ambulatory Visit: Payer: Self-pay | Admitting: Family Medicine

## 2015-08-09 ENCOUNTER — Other Ambulatory Visit: Payer: Self-pay | Admitting: Family Medicine

## 2015-09-08 ENCOUNTER — Other Ambulatory Visit: Payer: Self-pay

## 2015-09-08 NOTE — Telephone Encounter (Signed)
RX for Zolpidem was left up front for pick up and as of 09/08/15 was not so far. RX shredded now-aa

## 2015-10-04 ENCOUNTER — Other Ambulatory Visit: Payer: Self-pay | Admitting: Family Medicine

## 2015-12-03 ENCOUNTER — Other Ambulatory Visit: Payer: Self-pay | Admitting: Family Medicine

## 2015-12-13 ENCOUNTER — Encounter: Payer: Self-pay | Admitting: Family Medicine

## 2015-12-15 ENCOUNTER — Ambulatory Visit (INDEPENDENT_AMBULATORY_CARE_PROVIDER_SITE_OTHER): Payer: BLUE CROSS/BLUE SHIELD | Admitting: Ophthalmology

## 2015-12-15 DIAGNOSIS — H35372 Puckering of macula, left eye: Secondary | ICD-10-CM

## 2015-12-15 DIAGNOSIS — I10 Essential (primary) hypertension: Secondary | ICD-10-CM

## 2015-12-15 DIAGNOSIS — H43813 Vitreous degeneration, bilateral: Secondary | ICD-10-CM

## 2015-12-15 DIAGNOSIS — H338 Other retinal detachments: Secondary | ICD-10-CM

## 2015-12-15 DIAGNOSIS — H35033 Hypertensive retinopathy, bilateral: Secondary | ICD-10-CM | POA: Diagnosis not present

## 2016-01-04 ENCOUNTER — Other Ambulatory Visit: Payer: Self-pay | Admitting: Family Medicine

## 2016-01-25 ENCOUNTER — Encounter: Payer: Self-pay | Admitting: Family Medicine

## 2016-01-25 ENCOUNTER — Encounter: Payer: Self-pay | Admitting: General Surgery

## 2016-01-25 ENCOUNTER — Other Ambulatory Visit: Payer: Self-pay | Admitting: Family Medicine

## 2016-01-25 ENCOUNTER — Ambulatory Visit (INDEPENDENT_AMBULATORY_CARE_PROVIDER_SITE_OTHER): Payer: BLUE CROSS/BLUE SHIELD | Admitting: Family Medicine

## 2016-01-25 VITALS — BP 120/80 | HR 60 | Temp 97.6°F | Resp 16 | Ht 72.0 in | Wt 280.0 lb

## 2016-01-25 DIAGNOSIS — Z1211 Encounter for screening for malignant neoplasm of colon: Secondary | ICD-10-CM

## 2016-01-25 DIAGNOSIS — K429 Umbilical hernia without obstruction or gangrene: Secondary | ICD-10-CM

## 2016-01-25 DIAGNOSIS — Z125 Encounter for screening for malignant neoplasm of prostate: Secondary | ICD-10-CM | POA: Diagnosis not present

## 2016-01-25 DIAGNOSIS — Z1159 Encounter for screening for other viral diseases: Secondary | ICD-10-CM

## 2016-01-25 DIAGNOSIS — Z0000 Encounter for general adult medical examination without abnormal findings: Secondary | ICD-10-CM | POA: Diagnosis not present

## 2016-01-25 LAB — POCT URINALYSIS DIPSTICK
BILIRUBIN UA: NEGATIVE
Blood, UA: NEGATIVE
Glucose, UA: NEGATIVE
Ketones, UA: NEGATIVE
LEUKOCYTES UA: NEGATIVE
NITRITE UA: NEGATIVE
PH UA: 5
PROTEIN UA: NEGATIVE
Spec Grav, UA: >=1.03
Urobilinogen, UA: NEGATIVE

## 2016-01-25 NOTE — Progress Notes (Signed)
Patient: Wayne Goodwin, Male    DOB: October 13, 1950, 65 y.o.   MRN: [id number] Visit Date: 01/25/2016  Today's Provider: Wilhemena Durie, MD   Chief Complaint  Patient presents with  . Annual Exam   Subjective:  Wayne Goodwin is a 65 y.o. male who presents today for health maintenance and complete physical. He feels well. He reports exercising daily. He reports he is sleeping well.  Immunization History  Administered Date(s) Administered  . Hepatitis A 11/22/2011  . Hepatitis A, Adult 07/29/2014  . Hepatitis B 11/22/2011  . Tdap 04/01/2010, 07/29/2014  . Typhoid Inactivated 07/29/2014   Does not want Zoster 06/21/07 Colonoscopy May 2017 rectal exam and PSA at urology  Review of Systems  Constitutional: Negative.   HENT: Negative.   Eyes: Negative.   Respiratory: Negative.   Cardiovascular: Negative.   Gastrointestinal: Negative.   Endocrine: Negative.   Genitourinary: Negative.   Musculoskeletal: Negative.   Skin: Negative.   Allergic/Immunologic: Negative.   Neurological: Negative.   Hematological: Negative.   Psychiatric/Behavioral: Negative.     Social History   Social History  . Marital Status: Married    Spouse Name: N/A  . Number of Children: N/A  . Years of Education: N/A   Occupational History  . Not on file.   Social History Main Topics  . Smoking status: Never Smoker   . Smokeless tobacco: Never Used  . Alcohol Use: 0.0 oz/week    0 Standard drinks or equivalent per week     Comment: 12 per week  . Drug Use: No  . Sexual Activity: Not on file   Other Topics Concern  . Not on file   Social History Narrative    Patient Active Problem List   Diagnosis Date Noted  . Hypercholesteremia 07/14/2015  . Gout 07/14/2015  . BP (high blood pressure) 07/14/2015  . Insomnia, persistent 07/14/2015  . 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 CA of prostate ([place]) 07/14/2015  . Detached retina 07/14/2015  . 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 Prostate  cancer ([place]) 07/29/2014  . Essential hypertension 07/29/2014  . Hyperlipidemia 07/29/2014  . Adult BMI 30+ 01/30/2014  . Elevated prostate specific antigen (PSA) 11/14/2013  . Benign prostatic hyperplasia with urinary obstruction 11/14/2013    Past Surgical History  Procedure Laterality Date  . Retinal detachment surgery Bilateral   . Vasectomy    . Eye surgery      His family history includes Breast cancer in his mother; Cancer in his father; Gout in his father; Hypertension in his father and mother.    Outpatient Prescriptions Prior to Visit  Medication Sig Dispense Refill  . amLODipine-benazepril (LOTREL) 10-20 MG capsule TAKE ONE CAPSULE DAILY 30 capsule 12  . aspirin EC 81 MG tablet Take 81 mg by mouth.    . Psyllium 28.3 % POWD Take by mouth.    . rosuvastatin (CRESTOR) 10 MG tablet TAKE 1 TABLET EVERY DAY 30 tablet 1  . tamsulosin (FLOMAX) 0.4 MG CAPS capsule TAKE ONE CAPSULE DAILY 30 MINUTES AFTER THE SAME MEAL EACH DAY 30 capsule 12  . zolpidem (AMBIEN) 5 MG tablet TAKE ONE TABLET AT BEDTIME 30 tablet 5  . amoxicillin-clavulanate (AUGMENTIN) 875-125 MG tablet Take 1 tablet by mouth 2 (two) times daily. 20 tablet 0  . HYDROcodone-homatropine (HYCODAN) 5-1.5 MG/5ML syrup Take 5 mLs by mouth every 8 (eight) hours as needed for cough. 120 mL 0   No facility-administered medications prior to visit.    Patient  Care Team: Jerrol Banana., MD as PCP - General (Family Medicine)     Objective:   Vitals:  Filed Vitals:   01/25/16 0943  BP: 120/80  Pulse: 60  Temp: 97.6 F (36.4 C)  TempSrc: Oral  Resp: 16  Height: 6' (1.829 m)  Weight: 280 lb (127.007 kg)    Physical Exam  Constitutional: He is oriented to person, place, and time. He appears well-developed and well-nourished.  HENT:  Head: Normocephalic and atraumatic.  Right Ear: External ear normal.  Left Ear: External ear normal.  Nose: Nose normal.  Mouth/Throat: Oropharynx is clear and moist.  Eyes:  Conjunctivae and EOM are normal. Pupils are equal, round, and reactive to light.  Neck: Normal range of motion. Neck supple.  Cardiovascular: Normal rate, regular rhythm, normal heart sounds and intact distal pulses.   Pulmonary/Chest: Effort normal and breath sounds normal.  Abdominal: Soft. Bowel sounds are normal.  Moderate sized, reducible, umbilical hernia  Genitourinary: Penis normal.  Musculoskeletal: Normal range of motion.  Neurological: He is alert and oriented to person, place, and time.  Skin: Skin is warm and dry.  Psychiatric: He has a normal mood and affect. His behavior is normal. Judgment and thought content normal.     Depression Screen PHQ 2/9 Scores 01/25/2016  PHQ - 2 Score 0      Assessment & Plan:     Routine Health Maintenance and Physical Exam  Exercise Activities and Dietary recommendations Goals    None      Immunization History  Administered Date(s) Administered  . Hepatitis A 11/22/2011  . Hepatitis A, Adult 07/29/2014  . Hepatitis B 11/22/2011  . Tdap 04/01/2010, 07/29/2014  . Typhoid Inactivated 07/29/2014    Health Maintenance  Topic Date Due  . Hepatitis C Screening  [birth year]  . HIV Screening  07/16/1966  . ZOSTAVAX  07/17/2011  . INFLUENZA VACCINE  03/07/2016  . COLONOSCOPY  11/18/2016  . TETANUS/TDAP  07/29/2024   1. Annual physical exam  - CBC with Differential/Platelet - Comprehensive metabolic panel - Lipid Panel With LDL/HDL Ratio - TSH - POCT urinalysis dipstick  2. Prostate cancer screening Prostate cancer followed at [facility]. D[facility] by Dr. Mina Marble  at [facility]  3. Need for hepatitis C screening test  - Hepatitis C antibody  4. Umbilical hernia, recurrence not specified  - Ambulatory referral to General Surgery  5. Colon cancer screening  - Ambulatory referral to Gastroenterology 6. Obesity Patient is exercising daily. We discussed at length dietary changes.  Patient was seen and examined by Dr. Delfino Lovett L.  [place] Mon and the note was scribed by Althea Charon, RMA.  Discussed health benefits of physical activity, and encouraged him to engage in regular exercise appropriate for his age and condition.    ------------------------------------------------------------------------------------------------------------

## 2016-01-26 LAB — LIPID PANEL WITH LDL/HDL RATIO
Cholesterol, Total: 174 mg/dL (ref 100–199)
HDL: 43 mg/dL (ref >39)
TRIGLYCERIDES: 411 mg/dL — AB (ref 0–149)

## 2016-01-26 LAB — COMPREHENSIVE METABOLIC PANEL
ALT: 33 IU/L (ref 0–44)
AST: 29 IU/L (ref 0–40)
Albumin/Globulin Ratio: 1.9 (ref 1.2–2.2)
Albumin: 5 g/dL — ABNORMAL HIGH (ref 3.6–4.8)
Alkaline Phosphatase: 70 IU/L (ref 39–117)
BUN/Creatinine Ratio: 23 (ref 10–24)
BUN: 21 mg/dL (ref 8–27)
Bilirubin Total: 0.6 mg/dL (ref 0.0–1.2)
CALCIUM: 10.5 mg/dL — AB (ref 8.6–10.2)
CO2: 20 mmol/L (ref 18–29)
CREATININE: 0.92 mg/dL (ref 0.76–1.27)
Chloride: 101 mmol/L (ref 96–106)
GFR, EST AFRICAN AMERICAN: 101 mL/min/{1.73_m2} (ref >59)
GFR, EST NON AFRICAN AMERICAN: 88 mL/min/{1.73_m2} (ref >59)
GLOBULIN, TOTAL: 2.6 g/dL (ref 1.5–4.5)
Glucose: 116 mg/dL — ABNORMAL HIGH (ref 65–99)
Potassium: 4.4 mmol/L (ref 3.5–5.2)
SODIUM: 143 mmol/L (ref 134–144)
TOTAL PROTEIN: 7.6 g/dL (ref 6.0–8.5)

## 2016-01-26 LAB — CBC WITH DIFFERENTIAL/PLATELET
BASOS: 0 %
Basophils Absolute: 0 10*3/uL (ref 0.0–0.2)
EOS (ABSOLUTE): 0.2 10*3/uL (ref 0.0–0.4)
EOS: 2 %
HEMATOCRIT: 42.2 % (ref 37.5–51.0)
HEMOGLOBIN: 14.5 g/dL (ref 12.6–17.7)
IMMATURE GRANS (ABS): 0 10*3/uL (ref 0.0–0.1)
IMMATURE GRANULOCYTES: 0 %
LYMPHS: 26 %
Lymphocytes Absolute: 2 10*3/uL (ref 0.7–3.1)
MCH: 32.5 pg (ref 26.6–33.0)
MCHC: 34.4 g/dL (ref 31.5–35.7)
MCV: 95 fL (ref 79–97)
MONOCYTES: 7 %
MONOS ABS: 0.6 10*3/uL (ref 0.1–0.9)
NEUTROS PCT: 65 %
Neutrophils Absolute: 5.1 10*3/uL (ref 1.4–7.0)
Platelets: 181 10*3/uL (ref 150–379)
RBC: 4.46 x10E6/uL (ref 4.14–5.80)
RDW: 14.1 % (ref 12.3–15.4)
WBC: 7.9 10*3/uL (ref 3.4–10.8)

## 2016-01-26 LAB — TSH: TSH: 2.06 u[IU]/mL (ref 0.450–4.500)

## 2016-01-26 LAB — HEPATITIS C ANTIBODY: Hep C Virus Ab: <0.1 s/co ratio (ref 0.0–0.9)

## 2016-01-27 ENCOUNTER — Other Ambulatory Visit: Payer: Self-pay | Admitting: Family Medicine

## 2016-01-27 NOTE — Telephone Encounter (Signed)
Had wellness on 01/25/2016. Renaldo Fiddler, CMA

## 2016-01-31 ENCOUNTER — Telehealth: Payer: Self-pay

## 2016-01-31 NOTE — Telephone Encounter (Signed)
-----   Message from Jerrol Banana., MD sent at 01/28/2016 10:20 AM EDT ----- Labs okay except for prediabetes and high triglyceride. Exercise and especially dietary changes need to be made as discussed . Calcium slightly high. Would go ahead and repeat calcium along with ionized calcium and PTH. Thank you.

## 2016-01-31 NOTE — Telephone Encounter (Signed)
ok 

## 2016-01-31 NOTE — Telephone Encounter (Signed)
Pt advised of lab results, lab slip placed up front for the patient. He states he takes Tums at least once at bedtime and it has a lot of calcium in it, advised patient will need to make a change with that most likely.=aa

## 2016-02-10 ENCOUNTER — Telehealth: Payer: Self-pay | Admitting: *Deleted

## 2016-02-10 NOTE — Telephone Encounter (Signed)
Contacted Patient regarding cancelled appointment, he stated he would like to wait and reschedule in September. Patient will call back when he knows his schedule.

## 2016-02-14 ENCOUNTER — Ambulatory Visit: Payer: BLUE CROSS/BLUE SHIELD | Admitting: General Surgery

## 2016-02-16 LAB — PTH, INTACT AND CALCIUM
Calcium: 9.6 mg/dL (ref 8.6–10.2)
PTH: 37 pg/mL (ref 15–65)

## 2016-02-16 LAB — CALCIUM, IONIZED: Calcium, Ion: 5.2 mg/dL (ref 4.5–5.6)

## 2016-03-03 ENCOUNTER — Other Ambulatory Visit: Payer: Self-pay | Admitting: Family Medicine

## 2016-03-07 ENCOUNTER — Encounter: Payer: Self-pay | Admitting: *Deleted

## 2016-03-08 ENCOUNTER — Other Ambulatory Visit: Payer: Self-pay

## 2016-03-08 MED ORDER — DIPHENOXYLATE-ATROPINE 2.5-0.025 MG PO TABS: 1.0000 | ORAL_TABLET | Freq: Four times a day (QID) | ORAL | 0 refills | Status: DC | PRN

## 2016-03-08 MED ORDER — AZITHROMYCIN 250 MG PO TABS: ORAL_TABLET | ORAL | 0 refills | Status: DC

## 2016-03-08 MED ORDER — ZOLPIDEM TARTRATE 10 MG PO TABS: 10.0000 mg | ORAL_TABLET | Freq: Every evening | ORAL | 0 refills | Status: DC | PRN

## 2016-03-08 MED ORDER — PROMETHAZINE HCL 25 MG PO TABS: 25.0000 mg | ORAL_TABLET | Freq: Four times a day (QID) | ORAL | 0 refills | Status: DC | PRN

## 2016-04-14 ENCOUNTER — Telehealth: Payer: Self-pay | Admitting: Family Medicine

## 2016-04-14 NOTE — Telephone Encounter (Signed)
error 

## 2016-08-14 ENCOUNTER — Other Ambulatory Visit: Payer: Self-pay | Admitting: Family Medicine

## 2016-08-15 NOTE — Telephone Encounter (Signed)
Faxed Rx for zolpidem (AMBIEN) 5 MG tablet to [facility]. Thanks TNP

## 2016-10-31 ENCOUNTER — Other Ambulatory Visit: Payer: Self-pay | Admitting: Family Medicine

## 2016-12-13 ENCOUNTER — Encounter: Payer: Self-pay | Admitting: *Deleted

## 2016-12-14 ENCOUNTER — Ambulatory Visit (INDEPENDENT_AMBULATORY_CARE_PROVIDER_SITE_OTHER): Payer: BLUE CROSS/BLUE SHIELD | Admitting: Ophthalmology

## 2016-12-25 ENCOUNTER — Other Ambulatory Visit: Payer: Self-pay | Admitting: Family Medicine

## 2017-01-03 ENCOUNTER — Ambulatory Visit (INDEPENDENT_AMBULATORY_CARE_PROVIDER_SITE_OTHER): Payer: BLUE CROSS/BLUE SHIELD | Admitting: Ophthalmology

## 2017-01-03 DIAGNOSIS — H35033 Hypertensive retinopathy, bilateral: Secondary | ICD-10-CM

## 2017-01-03 DIAGNOSIS — H35373 Puckering of macula, bilateral: Secondary | ICD-10-CM

## 2017-01-03 DIAGNOSIS — H43813 Vitreous degeneration, bilateral: Secondary | ICD-10-CM | POA: Diagnosis not present

## 2017-01-03 DIAGNOSIS — I10 Essential (primary) hypertension: Secondary | ICD-10-CM

## 2017-01-03 DIAGNOSIS — H338 Other retinal detachments: Secondary | ICD-10-CM | POA: Diagnosis not present

## 2017-01-04 ENCOUNTER — Encounter: Payer: Self-pay | Admitting: *Deleted

## 2017-01-09 ENCOUNTER — Encounter: Payer: Self-pay | Admitting: General Surgery

## 2017-01-09 ENCOUNTER — Other Ambulatory Visit: Payer: Self-pay | Admitting: General Surgery

## 2017-01-09 ENCOUNTER — Other Ambulatory Visit: Payer: Self-pay

## 2017-01-09 ENCOUNTER — Ambulatory Visit (INDEPENDENT_AMBULATORY_CARE_PROVIDER_SITE_OTHER): Payer: BLUE CROSS/BLUE SHIELD | Admitting: General Surgery

## 2017-01-09 VITALS — BP 140/84 | HR 88 | Resp 14 | Ht 72.0 in | Wt 263.0 lb

## 2017-01-09 DIAGNOSIS — Z1211 Encounter for screening for malignant neoplasm of colon: Secondary | ICD-10-CM

## 2017-01-09 DIAGNOSIS — K429 Umbilical hernia without obstruction or gangrene: Secondary | ICD-10-CM | POA: Diagnosis not present

## 2017-01-09 DIAGNOSIS — Z86010 Personal history of colonic polyps: Secondary | ICD-10-CM

## 2017-01-09 MED ORDER — POLYETHYLENE GLYCOL 3350 17 GM/SCOOP PO POWD: 1.0000 | Freq: Once | ORAL | 0 refills | Status: AC

## 2017-01-09 NOTE — Progress Notes (Addendum)
Patient ID: Wayne Goodwin, male   DOB: 17-Dec-1950, 66 y.o.   MRN: [id number]  Chief Complaint  Patient presents with  . Umbilical Hernia    HPI Wayne Goodwin is a 66 y.o. male here today for a eva;uation of a umbilical hernia. Patient states he noticed this last year.In the last six months the area has changed in size. No pain.  Last colonoscopy was 02/15/2012 (negative biopsies. 2008 study reported adenomatous polyps. Pathology review pending.   HPI  Past Medical History:  Diagnosis Date  . Arthritis   . Cancer [facility]) 2006   prostate  . Hyperlipidemia   . Hypertension     Past Surgical History:  Procedure Laterality Date  . COLONOSCOPY  2008   Polyps identified.  . COLONOSCOPY W/ BIOPSIES  2013   Benign mucosal biopsy, suspected left colon lipoma. Wayne Goodwin, M.D.  . EYE SURGERY    . INSERTION PROSTATE RADIATION SEED  2015  . RETINAL DETACHMENT SURGERY Bilateral   . VASECTOMY      Family History  Problem Relation Age of Onset  . Hypertension Mother   . Breast cancer Mother   . Hypertension Father   . Cancer Father   . Gout Father     Social History Social History  Substance Use Topics  . Smoking status: Never Smoker  . Smokeless tobacco: Never Used  . Alcohol use 0.0 oz/week     Comment: 12 per week    No Known Allergies  Current Outpatient Prescriptions  Medication Sig Dispense Refill  . amLODipine-benazepril (LOTREL) 10-20 MG capsule TAKE ONE CAPSULE DAILY 30 capsule 2  . aspirin EC 81 MG tablet Take 81 mg by mouth.    . rosuvastatin (CRESTOR) 10 MG tablet TAKE 1 TABLET EVERY DAY 30 tablet 12  . tamsulosin (FLOMAX) 0.4 MG CAPS capsule TAKE ONE CAPSULE DAILY 30 MINUTES AFTER THE SAME MEAL EACH DAY 30 capsule 11  . zolpidem (AMBIEN) 5 MG tablet TAKE ONE TABLET AT BEDTIME 30 tablet 5   No current facility-administered medications for this visit.     Review of Systems Review of Systems  Constitutional: Negative.   Respiratory: Negative.    Cardiovascular: Negative.   Gastrointestinal: Negative.     Blood pressure 140/84, pulse 88, resp. rate 14, height 6' (1.829 m), weight 263 lb (119.3 kg).  Physical Exam Physical Exam  Constitutional: He is oriented to person, place, and time. He appears well-developed and well-nourished.  Neck: Neck supple.  Cardiovascular: Normal rate, regular rhythm and normal heart sounds.   Pulmonary/Chest: Effort normal and breath sounds normal.  Abdominal: Soft. Normal appearance and bowel sounds are normal. There is no hepatomegaly. There is no tenderness. Hernia: Moderate sized umbilical hernia. Nonreducible.    Lymphadenopathy:    He has no cervical adenopathy.  Neurological: He is alert and oriented to person, place, and time.  Skin: Skin is warm and dry.    Data Reviewed Comprehensive metabolic panel dated 36/62/9476 showed a creatinine of 0.9 with an estimated GFR of 88. Mild elevation of serum calcium at 10.5.  Subsequent calcium determination 9.6 with a normal parathormone level..  02/15/2012 colonoscopy biopsy of the proximal descending colon, minute superficial fragments of benign colonic mucosa.  November 09, 2006 colonoscopy results identified a tubular adenoma ( 0.7 cm ) in the sigmoid colon, hyperplastic polyp of the transverse colon and normal mucosa at the site of a suspected lipoma in the transverse colon.   Assessment    Enlarging umbilical  hernia.    Plan    Indication for repair reviewed. Indication for follow-up colonoscopy based on past history of adenomatous polyps discussed. The patient reports that he did awaken during his most recent study and wanted to be sure that he was adequately sedated for follow-up studies.  Hernia precautions and incarceration were discussed with the patient. If they develop symptoms of an incarcerated hernia, they were encouraged to seek prompt medical attention.  I have recommended repair of the hernia on an outpatient basis in the  near future. The risk of infection was reviewed. The role of prosthetic mesh to minimize the risk of recurrence was reviewed, and this will be determined at the time of surgery based on the fascial defect size.  Colonoscopy with possible biopsy/polypectomy prn: Information regarding the procedure, including its potential risks and complications (including but not limited to perforation of the bowel, which may require emergency surgery to repair, and bleeding) was verbally given to the patient. Educational information regarding lower intestinal endoscopy was given to the patient. Written instructions for how to complete the bowel prep using Miralax were provided. The importance of drinking ample fluids to avoid dehydration as a result of the prep emphasized.  HPI, Physical Exam, Assessment and Plan have been scribed under the direction and in the presence of Wayne Ard, MD.  Wayne Goodwin, CMA  I have completed the exam and reviewed the above documentation for accuracy and completeness.  I agree with the above.  Haematologist has been used and any errors in dictation or transcription are unintentional.  Wayne Goodwin, M.D., F.A.C.S.   Wayne Goodwin 01/09/2017, 10:28 AM

## 2017-01-09 NOTE — Progress Notes (Signed)
The patient is scheduled for surgery at [facility] on 01/18/17. He will pre admit by phone.  The patient is also scheduled for a Colonoscopy at [facility] on 03/14/17. They are aware to call the day before to get their arrival time. He may continue his 81 mg Aspriin. Miralax prescription has been sent into the patient's pharmacy. The patient is aware of date and instructions.

## 2017-01-09 NOTE — Patient Instructions (Addendum)
Umbilical Hernia, Adult A hernia is a bulge of tissue that pushes through an opening between muscles. An umbilical hernia happens in the abdomen, near the belly button (umbilicus). The hernia may contain tissues from the small intestine, large intestine, or fatty tissue covering the intestines (omentum). Umbilical hernias in adults tend to get worse over time, and they require surgical treatment. There are several types of umbilical hernias. You may have:  A hernia located just above or below the umbilicus (indirect hernia). This is the most common type of umbilical hernia in adults.  A hernia that forms through an opening formed by the umbilicus (direct hernia).  A hernia that comes and goes (reducible hernia). A reducible hernia may be visible only when you strain, lift something heavy, or cough. This type of hernia can be pushed back into the abdomen (reduced).  A hernia that traps abdominal tissue inside the hernia (incarcerated hernia). This type of hernia cannot be reduced.  A hernia that cuts off blood flow to the tissues inside the hernia (strangulated hernia). The tissues can start to die if this happens. This type of hernia requires emergency treatment.  What are the causes? An umbilical hernia happens when tissue inside the abdomen presses on a weak area of the abdominal muscles. What increases the risk? You may have a greater risk of this condition if you:  Are obese.  Have had several pregnancies.  Have a buildup of fluid inside your abdomen (ascites).  Have had surgery that weakens the abdominal muscles.  What are the signs or symptoms? The main symptom of this condition is a painless bulge at or near the belly button. A reducible hernia may be visible only when you strain, lift something heavy, or cough. Other symptoms may include:  Dull pain.  A feeling of pressure.  Symptoms of a strangulated hernia may include:  Pain that gets increasingly worse.  Nausea and  vomiting.  Pain when pressing on the hernia.  Skin over the hernia becoming red or purple.  Constipation.  Blood in the stool.  How is this diagnosed? This condition may be diagnosed based on:  A physical exam. You may be asked to cough or strain while standing. These actions increase the pressure inside your abdomen and force the hernia through the opening in your muscles. Your health care provider may try to reduce the hernia by pressing on it.  Your symptoms and medical history.  How is this treated? Surgery is the only treatment for an umbilical hernia. Surgery for a strangulated hernia is done as soon as possible. If you have a small hernia that is not incarcerated, you may need to lose weight before having surgery. Follow these instructions at home:  Lose weight, if told by your health care provider.  Do not try to push the hernia back in.  Watch your hernia for any changes in color or size. Tell your health care provider if any changes occur.  You may need to avoid activities that increase pressure on your hernia.  Do not lift anything that is heavier than 10 lb (4.5 kg) until your health care provider says that this is safe.  Take over-the-counter and prescription medicines only as told by your health care provider.  Keep all follow-up visits as told by your health care provider. This is important. Contact a health care provider if:  Your hernia gets larger.  Your hernia becomes painful. Get help right away if:  You develop sudden, severe pain near the   area of your hernia.  You have pain as well as nausea or vomiting.  You have pain and the skin over your hernia changes color.  You develop a fever. This information is not intended to replace advice given to you by your health care provider. Make sure you discuss any questions you have with your health care provider. Document Released: 12/24/2015 Document Revised: 03/26/2016 Document Reviewed:  12/24/2015 Elsevier Interactive Patient Education  [employer].  Colonoscopy, Adult A colonoscopy is an exam to look at the entire large intestine. During the exam, a lubricated, bendable tube is inserted into the anus and then passed into the rectum, colon, and other parts of the large intestine. A colonoscopy is often done as a part of normal colorectal screening or in response to certain symptoms, such as anemia, persistent diarrhea, abdominal pain, and blood in the stool. The exam can help screen for and diagnose medical problems, including: Tumors. Polyps. Inflammation. Areas of bleeding.  Tell a health care provider about: Any allergies you have. All medicines you are taking, including vitamins, herbs, eye drops, creams, and over-the-counter medicines. Any problems you or family members have had with anesthetic medicines. Any blood disorders you have. Any surgeries you have had. Any medical conditions you have. Any problems you have had passing stool. What are the risks? Generally, this is a safe procedure. However, problems may occur, including: Bleeding. A tear in the intestine. A reaction to medicines given during the exam. Infection (rare).  What happens before the procedure? Eating and drinking restrictions Follow instructions from your health care provider about eating and drinking, which may include: A few days before the procedure - follow a low-fiber diet. Avoid nuts, seeds, dried fruit, raw fruits, and vegetables. 1-3 days before the procedure - follow a clear liquid diet. Drink only clear liquids, such as clear broth or bouillon, black coffee or tea, clear juice, clear soft drinks or sports drinks, gelatin dessert, and popsicles. Avoid any liquids that contain red or purple dye. On the day of the procedure - do not eat or drink anything during the 2 hours before the procedure, or within the time period that your health care provider recommends.  Bowel prep If  you were prescribed an oral bowel prep to clean out your colon: Take it as told by your health care provider. Starting the day before your procedure, you will need to drink a large amount of medicated liquid. The liquid will cause you to have multiple loose stools until your stool is almost clear or light green. If your skin or anus gets irritated from diarrhea, you may use these to relieve the irritation: Medicated wipes, such as adult wet wipes with aloe and vitamin E. A skin soothing-product like petroleum jelly. If you vomit while drinking the bowel prep, take a break for up to 60 minutes and then begin the bowel prep again. If vomiting continues and you cannot take the bowel prep without vomiting, call your health care provider.  General instructions Ask your health care provider about changing or stopping your regular medicines. This is especially important if you are taking diabetes medicines or blood thinners. Plan to have someone take you home from the hospital or clinic. What happens during the procedure? An IV tube may be inserted into one of your veins. You will be given medicine to help you relax (sedative). To reduce your risk of infection: Your health care team will wash or sanitize their hands. Your anal area will be  washed with soap. You will be asked to lie on your side with your knees bent. Your health care provider will lubricate a long, thin, flexible tube. The tube will have a camera and a light on the end. The tube will be inserted into your anus. The tube will be gently eased through your rectum and colon. Air will be delivered into your colon to keep it open. You may feel some pressure or cramping. The camera will be used to take images during the procedure. A small tissue sample may be removed from your body to be examined under a microscope (biopsy). If any potential problems are found, the tissue will be sent to a lab for testing. If small polyps are found, your  health care provider may remove them and have them checked for cancer cells. The tube that was inserted into your anus will be slowly removed. The procedure may vary among health care providers and hospitals. What happens after the procedure? Your blood pressure, heart rate, breathing rate, and blood oxygen level will be monitored until the medicines you were given have worn off. Do not drive for 24 hours after the exam. You may have a small amount of blood in your stool. You may pass gas and have mild abdominal cramping or bloating due to the air that was used to inflate your colon during the exam. It is up to you to get the results of your procedure. Ask your health care provider, or the department performing the procedure, when your results will be ready. This information is not intended to replace advice given to you by your health care provider. Make sure you discuss any questions you have with your health care provider. Document Released: 07/21/2000 Document Revised: 05/24/2016 Document Reviewed: 10/05/2015 Elsevier Interactive Patient Education  2018 [employer].

## 2017-01-11 ENCOUNTER — Inpatient Hospital Stay: Admission: RE | Admit: 2017-01-11 | Payer: Managed Care, Other (non HMO) | Source: Ambulatory Visit

## 2017-01-15 ENCOUNTER — Encounter: Payer: Self-pay | Admitting: *Deleted

## 2017-01-15 DIAGNOSIS — Z8546 Personal history of malignant neoplasm of prostate: Secondary | ICD-10-CM | POA: Diagnosis not present

## 2017-01-15 DIAGNOSIS — E669 Obesity, unspecified: Secondary | ICD-10-CM | POA: Diagnosis not present

## 2017-01-15 DIAGNOSIS — M1990 Unspecified osteoarthritis, unspecified site: Secondary | ICD-10-CM | POA: Diagnosis not present

## 2017-01-15 DIAGNOSIS — Z79899 Other long term (current) drug therapy: Secondary | ICD-10-CM | POA: Diagnosis not present

## 2017-01-15 DIAGNOSIS — K420 Umbilical hernia with obstruction, without gangrene: Secondary | ICD-10-CM | POA: Diagnosis not present

## 2017-01-15 DIAGNOSIS — E785 Hyperlipidemia, unspecified: Secondary | ICD-10-CM | POA: Diagnosis not present

## 2017-01-15 DIAGNOSIS — I10 Essential (primary) hypertension: Secondary | ICD-10-CM | POA: Diagnosis not present

## 2017-01-15 DIAGNOSIS — Z7982 Long term (current) use of aspirin: Secondary | ICD-10-CM | POA: Diagnosis not present

## 2017-01-15 NOTE — Patient Instructions (Addendum)
Your procedure is scheduled on: 01/18/17 Thurs Report to Same Day Surgery 2nd floor medical mall [facility] Entrance-take elevator on left to 2nd floor.  Check in with surgery information desk.) To find out your arrival time please call [phone] between 1PM - 3PM on 01/17/17 Wed  Remember: Instructions that are not followed completely may result in serious medical risk, up to and including death, or upon the discretion of your surgeon and anesthesiologist your surgery may need to be rescheduled.    _x___ 1. Do not eat food or drink liquids after midnight. No gum chewing or                              hard candies.     __x__ 2. No Alcohol for 24 hours before or after surgery.   __x__3. No Smoking for 24 prior to surgery.   ____  4. Bring all medications with you on the day of surgery if instructed.    __x__ 5. Notify your doctor if there is any change in your medical condition     (cold, fever, infections).     Do not wear jewelry, make-up, hairpins, clips or nail polish.  Do not wear lotions, powders, or perfumes. You may wear deodorant.  Do not shave 48 hours prior to surgery. Men may shave face and neck.  Do not bring valuables to the hospital.    [facility] is not responsible for any belongings or valuables.               Contacts, dentures or bridgework may not be worn into surgery.  Leave your suitcase in the car. After surgery it may be brought to your room.  For patients admitted to the hospital, discharge time is determined by your                       treatment team.   Patients discharged the day of surgery will not be allowed to drive home.  You will need someone to drive you home and stay with you the night of your procedure.    Please read over the following fact sheets that you were given:   [facility] Preparing for Surgery and or MRSA Information   _x___ Take anti-hypertensive (unless it includes a diuretic), cardiac, seizure, asthma,     anti-reflux and  psychiatric medicines. These include:  1. None  2.  3.  4.  5.  6.  ____Fleets enema or Magnesium Citrate as directed.   _x___ Use CHG Soap or sage wipes as directed on instruction sheet   ____ Use inhalers on the day of surgery and bring to hospital day of surgery  ____ Stop Metformin and Janumet 2 days prior to surgery.    ____ Take 1/2 of usual insulin dose the night before surgery and none on the morning     surgery.   _x___ Follow recommendations from Cardiologist, Pulmonologist or PCP regarding          stopping Aspirin, Coumadin, Pllavix ,Eliquis, Effient, or Pradaxa, and Pletal.  X____Stop Anti-inflammatories such as Advil, Aleve, Ibuprofen, Motrin, Naproxen, Naprosyn, Goodies powders or aspirin products. OK to take Tylenol and                          Celebrex.   _x___ Stop supplements until after surgery.  But may continue Vitamin D, Vitamin B,  and multivitamin.   ____ Bring C-Pap to the hospital.

## 2017-01-16 ENCOUNTER — Encounter
Admission: RE | Admit: 2017-01-16 | Discharge: 2017-01-16 | Disposition: A | Discharge status: Home / Self Care | Payer: BLUE CROSS/BLUE SHIELD | Source: Ambulatory Visit | Attending: General Surgery | Admitting: General Surgery

## 2017-01-16 DIAGNOSIS — K420 Umbilical hernia with obstruction, without gangrene: Secondary | ICD-10-CM | POA: Diagnosis not present

## 2017-01-16 DIAGNOSIS — Z7982 Long term (current) use of aspirin: Secondary | ICD-10-CM | POA: Diagnosis not present

## 2017-01-16 DIAGNOSIS — M1990 Unspecified osteoarthritis, unspecified site: Secondary | ICD-10-CM | POA: Diagnosis not present

## 2017-01-16 DIAGNOSIS — E785 Hyperlipidemia, unspecified: Secondary | ICD-10-CM | POA: Diagnosis not present

## 2017-01-16 DIAGNOSIS — Z8546 Personal history of malignant neoplasm of prostate: Secondary | ICD-10-CM | POA: Diagnosis not present

## 2017-01-16 DIAGNOSIS — E669 Obesity, unspecified: Secondary | ICD-10-CM | POA: Diagnosis not present

## 2017-01-16 DIAGNOSIS — Z79899 Other long term (current) drug therapy: Secondary | ICD-10-CM | POA: Diagnosis not present

## 2017-01-16 DIAGNOSIS — I10 Essential (primary) hypertension: Secondary | ICD-10-CM | POA: Diagnosis not present

## 2017-01-17 MED ORDER — CEFAZOLIN SODIUM-DEXTROSE 2-4 GM/100ML-% IV SOLN
2.0000 g | INTRAVENOUS | Status: AC
  Administered 2017-01-18: 2 g via INTRAVENOUS

## 2017-01-18 ENCOUNTER — Encounter
Admission: RE | Disposition: A | Discharge status: Home / Self Care | Payer: Self-pay | Source: Ambulatory Visit | Attending: General Surgery

## 2017-01-18 ENCOUNTER — Ambulatory Visit: Payer: BLUE CROSS/BLUE SHIELD | Admitting: Anesthesiology

## 2017-01-18 ENCOUNTER — Encounter: Payer: Self-pay | Admitting: *Deleted

## 2017-01-18 ENCOUNTER — Ambulatory Visit
Admission: RE | Admit: 2017-01-18 | Discharge: 2017-01-18 | Disposition: A | Discharge status: Home / Self Care | Payer: BLUE CROSS/BLUE SHIELD | Source: Ambulatory Visit | Attending: General Surgery | Admitting: General Surgery

## 2017-01-18 DIAGNOSIS — I10 Essential (primary) hypertension: Secondary | ICD-10-CM | POA: Diagnosis not present

## 2017-01-18 DIAGNOSIS — Z8546 Personal history of malignant neoplasm of prostate: Secondary | ICD-10-CM | POA: Insufficient documentation

## 2017-01-18 DIAGNOSIS — E669 Obesity, unspecified: Secondary | ICD-10-CM | POA: Insufficient documentation

## 2017-01-18 DIAGNOSIS — K420 Umbilical hernia with obstruction, without gangrene: Secondary | ICD-10-CM | POA: Diagnosis not present

## 2017-01-18 DIAGNOSIS — Z79899 Other long term (current) drug therapy: Secondary | ICD-10-CM | POA: Insufficient documentation

## 2017-01-18 DIAGNOSIS — Z7982 Long term (current) use of aspirin: Secondary | ICD-10-CM | POA: Diagnosis not present

## 2017-01-18 DIAGNOSIS — E785 Hyperlipidemia, unspecified: Secondary | ICD-10-CM | POA: Diagnosis not present

## 2017-01-18 DIAGNOSIS — K429 Umbilical hernia without obstruction or gangrene: Secondary | ICD-10-CM

## 2017-01-18 DIAGNOSIS — M1990 Unspecified osteoarthritis, unspecified site: Secondary | ICD-10-CM | POA: Insufficient documentation

## 2017-01-18 HISTORY — PX: UMBILICAL HERNIA REPAIR: SHX196

## 2017-01-18 HISTORY — DX: Dyspnea, unspecified: R06.00

## 2017-01-18 HISTORY — DX: Adverse effect of unspecified anesthetic, initial encounter: T41.45XA

## 2017-01-18 HISTORY — DX: Benign prostatic hyperplasia without lower urinary tract symptoms: N40.0

## 2017-01-18 HISTORY — DX: Other complications of anesthesia, initial encounter: T88.59XA

## 2017-01-18 SURGERY — REPAIR, HERNIA, UMBILICAL, ADULT
Anesthesia: General | Wound class: Clean

## 2017-01-18 MED ORDER — FENTANYL CITRATE (PF) 100 MCG/2ML IJ SOLN
INTRAMUSCULAR | Status: AC
  Filled 2017-01-18: qty 2

## 2017-01-18 MED ORDER — BUPIVACAINE HCL (PF) 0.5 % IJ SOLN
INTRAMUSCULAR | Status: DC | PRN
  Administered 2017-01-18: 30 mL

## 2017-01-18 MED ORDER — ROCURONIUM BROMIDE 50 MG/5ML IV SOLN
INTRAVENOUS | Status: AC
  Filled 2017-01-18: qty 1

## 2017-01-18 MED ORDER — ROCURONIUM BROMIDE 100 MG/10ML IV SOLN
INTRAVENOUS | Status: DC | PRN
  Administered 2017-01-18: 20 mg via INTRAVENOUS

## 2017-01-18 MED ORDER — ONDANSETRON HCL 4 MG/2ML IJ SOLN
INTRAMUSCULAR | Status: DC | PRN
  Administered 2017-01-18: 4 mg via INTRAVENOUS

## 2017-01-18 MED ORDER — ONDANSETRON HCL 4 MG/2ML IJ SOLN
INTRAMUSCULAR | Status: AC
  Filled 2017-01-18: qty 2

## 2017-01-18 MED ORDER — LIDOCAINE HCL (PF) 2 % IJ SOLN
INTRAMUSCULAR | Status: AC
  Filled 2017-01-18: qty 2

## 2017-01-18 MED ORDER — SUGAMMADEX SODIUM 500 MG/5ML IV SOLN
INTRAVENOUS | Status: DC | PRN
  Administered 2017-01-18: 250 mg via INTRAVENOUS

## 2017-01-18 MED ORDER — DEXAMETHASONE SODIUM PHOSPHATE 10 MG/ML IJ SOLN
INTRAMUSCULAR | Status: AC
  Filled 2017-01-18: qty 1

## 2017-01-18 MED ORDER — MIDAZOLAM HCL 2 MG/2ML IJ SOLN
INTRAMUSCULAR | Status: AC
  Filled 2017-01-18: qty 2

## 2017-01-18 MED ORDER — KETOROLAC TROMETHAMINE 30 MG/ML IJ SOLN
INTRAMUSCULAR | Status: AC
  Filled 2017-01-18: qty 1

## 2017-01-18 MED ORDER — PHENYLEPHRINE HCL 10 MG/ML IJ SOLN
INTRAMUSCULAR | Status: AC
  Filled 2017-01-18: qty 1

## 2017-01-18 MED ORDER — LIDOCAINE HCL (CARDIAC) 20 MG/ML IV SOLN
INTRAVENOUS | Status: DC | PRN
  Administered 2017-01-18: 100 mg via INTRAVENOUS

## 2017-01-18 MED ORDER — OXYCODONE HCL 5 MG PO TABS: 5.0000 mg | ORAL_TABLET | Freq: Once | ORAL | Status: DC | PRN

## 2017-01-18 MED ORDER — ACETAMINOPHEN 10 MG/ML IV SOLN
INTRAVENOUS | Status: AC
  Filled 2017-01-18: qty 100

## 2017-01-18 MED ORDER — CEFAZOLIN SODIUM-DEXTROSE 2-4 GM/100ML-% IV SOLN
INTRAVENOUS | Status: AC
  Filled 2017-01-18: qty 100

## 2017-01-18 MED ORDER — FENTANYL CITRATE (PF) 100 MCG/2ML IJ SOLN: 25.0000 ug | INTRAMUSCULAR | Status: DC | PRN

## 2017-01-18 MED ORDER — MIDAZOLAM HCL 2 MG/2ML IJ SOLN
INTRAMUSCULAR | Status: DC | PRN
  Administered 2017-01-18: 2 mg via INTRAVENOUS

## 2017-01-18 MED ORDER — GLYCOPYRROLATE 0.2 MG/ML IJ SOLN
INTRAMUSCULAR | Status: DC | PRN
  Administered 2017-01-18: 0.2 mg via INTRAVENOUS

## 2017-01-18 MED ORDER — LACTATED RINGERS IV SOLN
INTRAVENOUS | Status: DC
  Administered 2017-01-18: 08:00:00 via INTRAVENOUS

## 2017-01-18 MED ORDER — ACETAMINOPHEN 10 MG/ML IV SOLN
INTRAVENOUS | Status: DC | PRN
  Administered 2017-01-18: 1000 mg via INTRAVENOUS

## 2017-01-18 MED ORDER — FENTANYL CITRATE (PF) 100 MCG/2ML IJ SOLN
INTRAMUSCULAR | Status: DC | PRN
  Administered 2017-01-18: 100 ug via INTRAVENOUS

## 2017-01-18 MED ORDER — PHENYLEPHRINE HCL 10 MG/ML IJ SOLN
INTRAMUSCULAR | Status: DC | PRN
  Administered 2017-01-18 (×3): 100 ug via INTRAVENOUS

## 2017-01-18 MED ORDER — PROPOFOL 500 MG/50ML IV EMUL
INTRAVENOUS | Status: AC
  Filled 2017-01-18: qty 50

## 2017-01-18 MED ORDER — MEPERIDINE HCL 50 MG/ML IJ SOLN: 6.2500 mg | INTRAMUSCULAR | Status: DC | PRN

## 2017-01-18 MED ORDER — FAMOTIDINE 20 MG PO TABS: 20.0000 mg | ORAL_TABLET | Freq: Once | ORAL | Status: DC

## 2017-01-18 MED ORDER — PROMETHAZINE HCL 25 MG/ML IJ SOLN: 6.2500 mg | INTRAMUSCULAR | Status: DC | PRN

## 2017-01-18 MED ORDER — BUPIVACAINE HCL (PF) 0.5 % IJ SOLN
INTRAMUSCULAR | Status: AC
  Filled 2017-01-18: qty 30

## 2017-01-18 MED ORDER — OXYCODONE HCL 5 MG/5ML PO SOLN: 5.0000 mg | Freq: Once | ORAL | Status: DC | PRN

## 2017-01-18 MED ORDER — SUCCINYLCHOLINE CHLORIDE 20 MG/ML IJ SOLN
INTRAMUSCULAR | Status: DC | PRN
  Administered 2017-01-18: 120 mg via INTRAVENOUS

## 2017-01-18 MED ORDER — SUCCINYLCHOLINE CHLORIDE 20 MG/ML IJ SOLN
INTRAMUSCULAR | Status: AC
  Filled 2017-01-18: qty 1

## 2017-01-18 MED ORDER — DEXAMETHASONE SODIUM PHOSPHATE 10 MG/ML IJ SOLN
INTRAMUSCULAR | Status: DC | PRN
  Administered 2017-01-18: 10 mg via INTRAVENOUS

## 2017-01-18 MED ORDER — SUGAMMADEX SODIUM 500 MG/5ML IV SOLN
INTRAVENOUS | Status: AC
  Filled 2017-01-18: qty 5

## 2017-01-18 MED ORDER — KETOROLAC TROMETHAMINE 30 MG/ML IJ SOLN
INTRAMUSCULAR | Status: DC | PRN
  Administered 2017-01-18: 30 mg via INTRAVENOUS

## 2017-01-18 MED ORDER — HYDROCODONE-ACETAMINOPHEN 5-325 MG PO TABS: 1.0000 | ORAL_TABLET | ORAL | 0 refills | Status: DC | PRN

## 2017-01-18 MED ORDER — IPRATROPIUM-ALBUTEROL 0.5-2.5 (3) MG/3ML IN SOLN
RESPIRATORY_TRACT | Status: AC
  Filled 2017-01-18: qty 3

## 2017-01-18 MED ORDER — PROPOFOL 10 MG/ML IV BOLUS
INTRAVENOUS | Status: DC | PRN
  Administered 2017-01-18: 50 mg via INTRAVENOUS
  Administered 2017-01-18: 200 mg via INTRAVENOUS
  Administered 2017-01-18: 50 mg via INTRAVENOUS

## 2017-01-18 MED ORDER — BUPIVACAINE-EPINEPHRINE (PF) 0.5% -1:200000 IJ SOLN
INTRAMUSCULAR | Status: AC
  Filled 2017-01-18: qty 30

## 2017-01-18 SURGICAL SUPPLY — 30 items
BLADE SURG 15 STRL SS SAFETY (BLADE) ×3 IMPLANT
CANISTER SUCT 1200ML W/VALVE (MISCELLANEOUS) ×3 IMPLANT
CHLORAPREP W/TINT 26ML (MISCELLANEOUS) ×3 IMPLANT
CLOSURE WOUND 1/2 X4 (GAUZE/BANDAGES/DRESSINGS) ×1
DRAPE LAPAROTOMY 100X77 ABD (DRAPES) ×3 IMPLANT
DRSG TEGADERM 4X4.75 (GAUZE/BANDAGES/DRESSINGS) ×3 IMPLANT
DRSG TELFA 4X3 1S NADH ST (GAUZE/BANDAGES/DRESSINGS) ×3 IMPLANT
ELECT REM PT RETURN 9FT ADLT (ELECTROSURGICAL) ×3
ELECTRODE REM PT RTRN 9FT ADLT (ELECTROSURGICAL) ×1 IMPLANT
GLOVE BIO SURGEON STRL SZ7.5 (GLOVE) ×9 IMPLANT
GLOVE INDICATOR 8.0 STRL GRN (GLOVE) ×6 IMPLANT
GOWN STRL REUS W/ TWL LRG LVL3 (GOWN DISPOSABLE) ×2 IMPLANT
GOWN STRL REUS W/TWL LRG LVL3 (GOWN DISPOSABLE) ×4
KIT RM TURNOVER STRD PROC AR (KITS) ×3 IMPLANT
LABEL OR SOLS (LABEL) ×3 IMPLANT
NDL SAFETY 22GX1.5 (NEEDLE) ×3 IMPLANT
NEEDLE HYPO 25X1 1.5 SAFETY (NEEDLE) ×3 IMPLANT
NS IRRIG 500ML POUR BTL (IV SOLUTION) ×3 IMPLANT
PACK BASIN MINOR ARMC (MISCELLANEOUS) ×3 IMPLANT
STRIP CLOSURE SKIN 1/2X4 (GAUZE/BANDAGES/DRESSINGS) ×2 IMPLANT
SUT PROLENE 0 CT 1 30 (SUTURE) IMPLANT
SUT SURGILON 0 BLK (SUTURE) ×6 IMPLANT
SUT VIC AB 3-0 54X BRD REEL (SUTURE) ×2 IMPLANT
SUT VIC AB 3-0 BRD 54 (SUTURE) ×4
SUT VIC AB 3-0 SH 27 (SUTURE) ×2
SUT VIC AB 3-0 SH 27X BRD (SUTURE) ×1 IMPLANT
SUT VIC AB 4-0 FS2 27 (SUTURE) ×3 IMPLANT
SWABSTK COMLB BENZOIN TINCTURE (MISCELLANEOUS) ×3 IMPLANT
SYR 3ML LL SCALE MARK (SYRINGE) ×3 IMPLANT
SYR CONTROL 10ML (SYRINGE) ×6 IMPLANT

## 2017-01-18 NOTE — Anesthesia Postprocedure Evaluation (Signed)
Anesthesia Post Note  Patient: Wayne Goodwin  Procedure(s) Performed: Procedure(s) (LRB): HERNIA REPAIR UMBILICAL ADULT (N/A)  Patient location during evaluation: PACU Anesthesia Type: General Level of consciousness: awake and alert and oriented Pain management: pain level controlled Vital Signs Assessment: post-procedure vital signs reviewed and stable Respiratory status: spontaneous breathing, nonlabored ventilation and respiratory function stable Cardiovascular status: blood pressure returned to baseline and stable Postop Assessment: no signs of nausea or vomiting Anesthetic complications: no     Last Vitals:  Vitals:   01/18/17 1030 01/18/17 1040  BP:  119/65  Pulse: 84 82  Resp: (!) 21 20  Temp: 36.8 C (!) 35.6 C    Last Pain:  Vitals:   01/18/17 1040  TempSrc: Tympanic  PainSc: 1                  [place] [place]

## 2017-01-18 NOTE — Anesthesia Post-op Follow-up Note (Cosign Needed)
Anesthesia QCDR form completed.        

## 2017-01-18 NOTE — H&P (Signed)
No change in clinical history or exam.  

## 2017-01-18 NOTE — OR Nursing (Signed)
Permission from Dr Fleet Contras in Or to disch pt home prior to being seen by him

## 2017-01-18 NOTE — Discharge Instructions (Signed)

## 2017-01-18 NOTE — Anesthesia Procedure Notes (Signed)
Procedure Name: Intubation Date/Time: 01/18/2017 8:59 AM Performed by: Aline Brochure Pre-anesthesia Checklist: Patient identified, Emergency Drugs available, Suction available and Patient being monitored Patient Re-evaluated:Patient Re-evaluated prior to inductionOxygen Delivery Method: Circle system utilized Preoxygenation: Pre-oxygenation with 100% oxygen Intubation Type: IV induction Ventilation: Oral airway inserted - appropriate to patient size and Two handed mask ventilation required Laryngoscope Size: Mac and 3 Grade View: Grade I Tube type: Oral Tube size: 7.0 mm Number of attempts: 1 Airway Equipment and Method: Stylet Placement Confirmation: ETT inserted through vocal cords under direct vision,  positive ETCO2 and breath sounds checked- equal and bilateral Secured at: 22 cm Tube secured with: Tape Dental Injury: Teeth and Oropharynx as per pre-operative assessment

## 2017-01-18 NOTE — Anesthesia Preprocedure Evaluation (Signed)
Anesthesia Evaluation  Patient identified by MRN, date of birth, ID band Patient awake    Reviewed: Allergy & Precautions, NPO status , Patient's Chart, lab work & pertinent test results  History of Anesthesia Complications Negative for: history of anesthetic complications  Airway Mallampati: II  TM Distance: >3 FB Neck ROM: Full    Dental no notable dental hx.    Pulmonary neg sleep apnea, neg COPD,    breath sounds clear to auscultation- rhonchi (-) wheezing      Cardiovascular Exercise Tolerance: Good hypertension, Pt. on medications (-) CAD, (-) Past MI and (-) Cardiac Stents  Rhythm:Regular Rate:Normal - Systolic murmurs and - Diastolic murmurs    Neuro/Psych negative neurological ROS  negative psych ROS   GI/Hepatic negative GI ROS, Neg liver ROS,   Endo/Other  negative endocrine ROSneg diabetes  Renal/GU negative Renal ROS     Musculoskeletal  (+) Arthritis ,   Abdominal (+) + obese,   Peds  Hematology negative hematology ROS (+)   Anesthesia Other Findings Past Medical History: No date: Arthritis No date: BPH (benign prostatic hyperplasia) 2006: Cancer ([place])     Comment: prostate No date: Complication of anesthesia     Comment: woke up during 2 procedures No date: Dyspnea No date: Hyperlipidemia No date: Hypertension   Reproductive/Obstetrics                             Anesthesia Physical Anesthesia Plan  ASA: II  Anesthesia Plan: General   Post-op Pain Management:    Induction: Intravenous  PONV Risk Score and Plan: 1 and Ondansetron, Dexamethasone and Propofol  Airway Management Planned: LMA  Additional Equipment:   Intra-op Plan:   Post-operative Plan:   Informed Consent: I have reviewed the patients History and Physical, chart, labs and discussed the procedure including the risks, benefits and alternatives for the proposed anesthesia with the patient or  authorized representative who has indicated his/her understanding and acceptance.   Dental advisory given  Plan Discussed with: CRNA and Anesthesiologist  Anesthesia Plan Comments:         Anesthesia Quick Evaluation

## 2017-01-18 NOTE — Op Note (Signed)
Preoperative diagnosis: Umbilical hernia with incarcerated fat.  Postoperative diagnosis: Same.  Operative procedure: Umbilical hernia repair.  Operating surgeon: Ollen Bowl, M.D.  Anesthesia: Gen. endotracheal, Marcaine 0.5%, plain, 30 mL local infiltration.  Estimated blood loss: 5 mL.  Clinical note: This 66 year old male has developed asymptomatic umbilical hernia. He was made for elective repair. It was not possible to clearly identify the fascial defect at the time of his preoperative evaluation. The possible use of prosthetic mesh was reviewed. Kefzol was administered on induction of anesthesia.  Operative note: With the patient under adequate general anesthesia (LMA initially placed, exchanged for an endotracheal tube prior to prepping) the abdomen was cleansed with ChloraPrep and draped. Marcaine was infiltrated for postoperative analgesia. An infraumbilical incision was made and carried down to the skin subtenon's tissue with hemostasis achieved by electrocautery. The hernia sac was dissected free from the overlying umbilical skin excised at the fascial layer and discarded. The incarcerated omentum was ligated with 3-0 Vicryl ties and excised. The omental pedicle was returned to the abdominal cavity. The fascial defect was less than 1 cm in diameter. The undersurface was cleared with the back end of a forcep and interrupted 0 Surgilon sutures placed under direct vision. These were tied sequentially. The umbilical skin was tacked to the fascia with a 3-0 Vicryl figure-of-eight suture. The adipose layer was closed with a running 3-0 Vicryl. The skin was closed with a running 4-0 Vicryl septic suture. Benzoin, Steri-Strips, Telfa and Tegaderm dressing applied.  The patient had vigorous, prolonged coughing during emergence from anesthesia. External pressure was applied to the surgical site during this time.  The patient was taken to the recovery room in stable condition.

## 2017-01-18 NOTE — Transfer of Care (Signed)
Immediate Anesthesia Transfer of Care Note  Patient: Wayne Goodwin  Procedure(s) Performed: Procedure(s): HERNIA REPAIR UMBILICAL ADULT (N/A)  Patient Location: PACU  Anesthesia Type:General  Level of Consciousness: awake, alert  and oriented  Airway & Oxygen Therapy: Patient connected to face mask oxygen  Post-op Assessment: Post -op Vital signs reviewed and stable  Post vital signs: stable  Last Vitals:  Vitals:   01/18/17 0732 01/18/17 0951  BP: (!) 164/70 119/78  Pulse: 88 95  Resp: 16 17  Temp: 36.4 C 36.9 C    Last Pain:  Vitals:   01/18/17 0951  TempSrc: Temporal  PainSc:          Complications: No apparent anesthesia complications

## 2017-01-19 ENCOUNTER — Encounter: Payer: Self-pay | Admitting: General Surgery

## 2017-01-29 ENCOUNTER — Other Ambulatory Visit: Payer: Self-pay | Admitting: Family Medicine

## 2017-01-30 ENCOUNTER — Encounter: Payer: Self-pay | Admitting: General Surgery

## 2017-01-30 ENCOUNTER — Ambulatory Visit (INDEPENDENT_AMBULATORY_CARE_PROVIDER_SITE_OTHER): Payer: BLUE CROSS/BLUE SHIELD | Admitting: General Surgery

## 2017-01-30 VITALS — BP 134/84 | HR 78 | Resp 14 | Ht 72.0 in | Wt 287.0 lb

## 2017-01-30 DIAGNOSIS — K429 Umbilical hernia without obstruction or gangrene: Secondary | ICD-10-CM

## 2017-01-30 NOTE — Patient Instructions (Addendum)
The patient is aware to call back for any questions or concerns.    Proper lifting techniques reviewed. Resume activities as tolerated. 

## 2017-01-30 NOTE — Telephone Encounter (Signed)
Last office visit was 01/25/16 and no future appointments scheduled-aa

## 2017-01-30 NOTE — Progress Notes (Signed)
Patient ID: Wayne Goodwin, male   DOB: May 21, 1951, 66 y.o.   MRN: [id number]  Chief Complaint  Patient presents with  . Routine Post Op    umbilical hernia    HPI Wayne Goodwin is a 66 y.o. male here today for his post op umbilical hernia repair done on 01/18/2017. Patient states he is doing well and moving his bowels daily.  No need for narcotic analgesics     HPI  Past Medical History:  Diagnosis Date  . Arthritis   . BPH (benign prostatic hyperplasia)   . Cancer [facility]) 2006   prostate  . Complication of anesthesia    woke up during 2 procedures  . Dyspnea   . Hyperlipidemia   . Hypertension     Past Surgical History:  Procedure Laterality Date  . CATARACT EXTRACTION W/ INTRAOCULAR LENS  IMPLANT, BILATERAL    . COLONOSCOPY  2008   Polyps identified.  . COLONOSCOPY W/ BIOPSIES  2013   Benign mucosal biopsy, suspected left colon lipoma. [name], M.D.  . EYE SURGERY    . INSERTION PROSTATE RADIATION SEED  2015  . RETINAL DETACHMENT SURGERY Bilateral   . UMBILICAL HERNIA REPAIR N/A 01/18/2017   Procedure: HERNIA REPAIR UMBILICAL ADULT;  Surgeon: Robert Bellow, MD;  Location: [facility] ORS;  Service: General;  Laterality: N/A;  . VASECTOMY      Family History  Problem Relation Age of Onset  . Hypertension Mother   . Breast cancer Mother   . Hypertension Father   . Cancer Father   . Gout Father     Social History Social History  Substance Use Topics  . Smoking status: Never Smoker  . Smokeless tobacco: Never Used  . Alcohol use 0.0 oz/week     Comment: 12 per week    No Known Allergies  Current Outpatient Prescriptions  Medication Sig Dispense Refill  . amLODipine-benazepril (LOTREL) 10-20 MG capsule TAKE ONE CAPSULE DAILY 30 capsule 11  . aspirin EC 81 MG tablet Take 81 mg by mouth.    Marland Kitchen Bioflavonoid Products (BIOFLEX PO) Take 2 capsules by mouth daily.    . Milk Thistle 250 MG CAPS Take 1 capsule by mouth daily.    . Multiple Vitamin  (MULTIVITAMIN) tablet Take 1 tablet by mouth daily.    . rosuvastatin (CRESTOR) 10 MG tablet TAKE 1 TABLET EVERY DAY (Patient taking differently: qhs) 30 tablet 12  . tamsulosin (FLOMAX) 0.4 MG CAPS capsule TAKE ONE CAPSULE DAILY 30 MINUTES AFTER THE SAME MEAL EACH DAY 30 capsule 11  . zolpidem (AMBIEN) 5 MG tablet TAKE ONE TABLET AT BEDTIME 30 tablet 5   No current facility-administered medications for this visit.     Review of Systems Review of Systems  Blood pressure 134/84, pulse 78, resp. rate 14, height 6' (1.829 m), weight 287 lb (130.2 kg).  Physical Exam Physical Exam  Constitutional: He is oriented to person, place, and time. He appears well-developed and well-nourished.  Abdominal: Soft. Normal appearance and bowel sounds are normal. There is no tenderness. No hernia.    Neurological: He is alert and oriented to person, place, and time.  Skin: Skin is warm and dry.  Psychiatric: His behavior is normal.      Assessment   Doing well post umbilical hernia repair.  Plan        Follow up as needed. Proper lifting techniques reviewed. Resume activities as tolerated. The patient is aware to call back for any questions or  concerns.   HPI, Physical Exam, Assessment and Plan have been scribed under the direction and in the presence of Robert Bellow, MD.  Karie Fetch, RN  I have completed the exam and reviewed the above documentation for accuracy and completeness.  I agree with the above.  Haematologist has been used and any errors in dictation or transcription are unintentional.  Hervey Ard, M.D., F.A.C.S.  Robert Bellow 01/30/2017, 9:10 PM

## 2017-02-03 ENCOUNTER — Encounter: Payer: Self-pay | Admitting: General Surgery

## 2017-02-06 ENCOUNTER — Other Ambulatory Visit: Payer: Self-pay | Admitting: Family Medicine

## 2017-03-07 ENCOUNTER — Telehealth: Payer: Self-pay | Admitting: *Deleted

## 2017-03-07 NOTE — Telephone Encounter (Signed)
Patient called the office wanting to reschedule colonoscopy due to a work conflict. He was scheduled for 03-14-17 at [facility].   This patient was offered 05-02-17 but declined due to something that he already had scheduled.   Patient requests to be contacted once we have October schedule available to get new colonoscopy date.  Trish in endoscopy is aware of cancellation.

## 2017-03-14 ENCOUNTER — Ambulatory Visit: Admit: 2017-03-14 | Payer: Managed Care, Other (non HMO) | Admitting: General Surgery

## 2017-03-14 SURGERY — COLONOSCOPY WITH PROPOFOL
Anesthesia: General

## 2017-03-26 NOTE — Telephone Encounter (Signed)
Patient's colonoscopy has been rescheduled to 05-30-17 at [facility].  This patient was instructed to call the office should he have further questions.

## 2017-03-28 ENCOUNTER — Other Ambulatory Visit: Payer: Self-pay | Admitting: Family Medicine

## 2017-05-01 ENCOUNTER — Encounter: Payer: Self-pay | Admitting: Family Medicine

## 2017-05-01 ENCOUNTER — Ambulatory Visit (INDEPENDENT_AMBULATORY_CARE_PROVIDER_SITE_OTHER): Payer: BLUE CROSS/BLUE SHIELD | Admitting: Family Medicine

## 2017-05-01 VITALS — BP 126/72 | HR 82 | Temp 97.6°F | Resp 16 | Ht 72.0 in | Wt 290.0 lb

## 2017-05-01 DIAGNOSIS — Z0000 Encounter for general adult medical examination without abnormal findings: Secondary | ICD-10-CM

## 2017-05-01 DIAGNOSIS — Z23 Encounter for immunization: Secondary | ICD-10-CM | POA: Diagnosis not present

## 2017-05-01 DIAGNOSIS — E669 Obesity, unspecified: Secondary | ICD-10-CM

## 2017-05-01 LAB — CBC WITH DIFFERENTIAL/PLATELET
BASOS ABS: 20 {cells}/uL (ref 0–200)
Basophils Relative: 0.2 %
EOS ABS: 190 {cells}/uL (ref 15–500)
Eosinophils Relative: 1.9 %
HEMATOCRIT: 37.8 % — AB (ref 38.5–50.0)
Hemoglobin: 13.4 g/dL (ref 13.2–17.1)
LYMPHS ABS: 2030 {cells}/uL (ref 850–3900)
MCH: 33 pg (ref 27.0–33.0)
MCHC: 35.4 g/dL (ref 32.0–36.0)
MCV: 93.1 fL (ref 80.0–100.0)
MPV: 11.1 fL (ref 7.5–12.5)
Monocytes Relative: 6.9 %
NEUTROS PCT: 70.7 %
Neutro Abs: 7070 cells/uL (ref 1500–7800)
PLATELETS: 180 10*3/uL (ref 140–400)
RBC: 4.06 10*6/uL — AB (ref 4.20–5.80)
RDW: 13.1 % (ref 11.0–15.0)
TOTAL LYMPHOCYTE: 20.3 %
WBC: 10 10*3/uL (ref 3.8–10.8)
WBCMIX: 690 {cells}/uL (ref 200–950)

## 2017-05-01 LAB — COMPREHENSIVE METABOLIC PANEL
AG Ratio: 2.4 (calc) (ref 1.0–2.5)
ALBUMIN MSPROF: 4.7 g/dL (ref 3.6–5.1)
ALKALINE PHOSPHATASE (APISO): 70 U/L (ref 40–115)
ALT: 25 U/L (ref 9–46)
AST: 25 U/L (ref 10–35)
BUN: 21 mg/dL (ref 7–25)
CHLORIDE: 105 mmol/L (ref 98–110)
CO2: 26 mmol/L (ref 20–32)
CREATININE: 0.89 mg/dL (ref 0.70–1.25)
Calcium: 9.5 mg/dL (ref 8.6–10.3)
GLOBULIN: 2 g/dL (ref 1.9–3.7)
GLUCOSE: 116 mg/dL — AB (ref 65–99)
POTASSIUM: 4.2 mmol/L (ref 3.5–5.3)
Sodium: 141 mmol/L (ref 135–146)
TOTAL PROTEIN: 6.7 g/dL (ref 6.1–8.1)
Total Bilirubin: 0.7 mg/dL (ref 0.2–1.2)

## 2017-05-01 LAB — LIPID PANEL
Cholesterol: 146 mg/dL (ref <200)
HDL: 38 mg/dL — ABNORMAL LOW (ref >40)
LDL Cholesterol (Calc): 63 mg/dL (calc)
NON-HDL CHOLESTEROL (CALC): 108 mg/dL (ref <130)
TRIGLYCERIDES: 371 mg/dL — AB (ref <150)
Total CHOL/HDL Ratio: 3.8 (calc) (ref <5.0)

## 2017-05-01 LAB — POCT URINALYSIS DIPSTICK
Bilirubin, UA: NEGATIVE
Blood, UA: NEGATIVE
GLUCOSE UA: NEGATIVE
KETONES UA: NEGATIVE
LEUKOCYTES UA: NEGATIVE
Nitrite, UA: NEGATIVE
PROTEIN UA: NEGATIVE
Spec Grav, UA: 1.025 (ref 1.010–1.025)
Urobilinogen, UA: 0.2 E.U./dL
pH, UA: 6 (ref 5.0–8.0)

## 2017-05-01 LAB — TSH: TSH: 1.46 mIU/L (ref 0.40–4.50)

## 2017-05-01 MED ORDER — PHENTERMINE HCL 37.5 MG PO CAPS: 37.5000 mg | ORAL_CAPSULE | ORAL | 2 refills | Status: DC

## 2017-05-01 NOTE — Progress Notes (Signed)
Patient: Wayne Goodwin, Male    DOB: July 23, 1951, 66 y.o.   MRN: [id number] Visit Date: 05/01/2017  Today's Provider: Wilhemena Durie, MD   Chief Complaint  Patient presents with  . Annual Exam   Subjective:    Annual physical exam Wayne Goodwin is a 66 y.o. male who presents today for health maintenance and complete physical. He feels well. He reports exercising, swimming an hour on weekdays. He reports he is sleeping well.  he is unable to exercise like he used to due to arthritic pain. He does swim an hour per day so does get some regular movement.   Everything is okay. His older daughter  is getting divorced with 2 children.  he says that on 978 fell and thinks he fractured his coccyx. The pain is getting better.  Last PSA for prostate cancer with undetectable. He did have lateral detached retinas in the past year and is vision is now back to 20/20.  ----------------------------------------------------------------- Colonoscopy- 02/15/12 diverticulosis at hepatic flexure, hemorrhoids, large lipoma otherwise normal. Scheduled 05/30/17 [place].   Immunization History  Administered Date(s) Administered  . Hepatitis A 11/22/2011  . Hepatitis A, Adult 07/29/2014  . Hepatitis B 11/22/2011  . Tdap 04/01/2010, 07/29/2014  . Typhoid Inactivated 07/29/2014     Review of Systems  Constitutional: Negative.   HENT: Negative.   Eyes: Negative.   Respiratory: Negative.   Cardiovascular: Negative.   Gastrointestinal: Negative.   Endocrine: Negative.   Genitourinary: Negative.   Musculoskeletal: Negative.   Skin: Negative.   Allergic/Immunologic: Negative.   Neurological: Negative.   Hematological: Negative.   Psychiatric/Behavioral: Negative.     Social History      He  reports that he has never smoked. He has never used smokeless tobacco. He reports that he drinks alcohol. He reports that he does not use drugs.       Social History   Social History  . Marital  status: Married    Spouse name: N/A  . Number of children: N/A  . Years of education: N/A   Social History Main Topics  . Smoking status: Never Smoker  . Smokeless tobacco: Never Used  . Alcohol use 0.0 oz/week     Comment: 12 per week  . Drug use: No  . Sexual activity: Not Asked   Other Topics Concern  . None   Social History Narrative  . None    Past Medical History:  Diagnosis Date  . Arthritis   . BPH (benign prostatic hyperplasia)   . Cancer [facility]) 2006   prostate  . Complication of anesthesia    woke up during 2 procedures  . Dyspnea   . Hyperlipidemia   . Hypertension      Patient Active Problem List   Diagnosis Date Noted  . Hypercholesteremia 07/14/2015  . Gout 07/14/2015  . BP (high blood pressure) 07/14/2015  . Insomnia, persistent 07/14/2015  . 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 CA of prostate ([place]) 07/14/2015  . Detached retina 07/14/2015  . 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 Prostate cancer ([place]) 07/29/2014  . Essential hypertension 07/29/2014  . Hyperlipidemia 07/29/2014  . Adult BMI 30+ 01/30/2014  . Elevated prostate specific antigen (PSA) 11/14/2013  . Benign prostatic hyperplasia with urinary obstruction 11/14/2013    Past Surgical History:  Procedure Laterality Date  . CATARACT EXTRACTION W/ INTRAOCULAR LENS  IMPLANT, BILATERAL    . COLONOSCOPY  2008   Tubular adenoma of the sigmoid  colon, hyperplastic polyp 1. [name], M.D.  . COLONOSCOPY W/ BIOPSIES  2013   Benign mucosal biopsy, suspected left colon lipoma. [name], M.D.  . EYE SURGERY    . INSERTION PROSTATE RADIATION SEED  2015  . RETINAL DETACHMENT SURGERY Bilateral   . UMBILICAL HERNIA REPAIR N/A 01/18/2017   Procedure: HERNIA REPAIR UMBILICAL ADULT;  Surgeon: Robert Bellow, MD;  Location: [facility] ORS;  Service: General;  Laterality: N/A;  . VASECTOMY      Family History        Family Status  Relation Status  . Mother Deceased at age 40  . Father  Deceased at age 65  . Brother Alive        His family history includes Breast cancer in his mother; Cancer in his father; Gout in his father; Hypertension in his father and mother.     No Known Allergies   Current Outpatient Prescriptions:  .  amLODipine-benazepril (LOTREL) 10-20 MG capsule, TAKE ONE CAPSULE DAILY, Disp: 30 capsule, Rfl: 11 .  aspirin EC 81 MG tablet, Take 81 mg by mouth., Disp: , Rfl:  .  Bioflavonoid Products (BIOFLEX PO), Take 2 capsules by mouth daily., Disp: , Rfl:  .  Milk Thistle 250 MG CAPS, Take 1 capsule by mouth daily., Disp: , Rfl:  .  Multiple Vitamin (MULTIVITAMIN) tablet, Take 1 tablet by mouth daily., Disp: , Rfl:  .  rosuvastatin (CRESTOR) 10 MG tablet, TAKE 1 TABLET EVERY DAY, Disp: 30 tablet, Rfl: 5 .  tamsulosin (FLOMAX) 0.4 MG CAPS capsule, TAKE ONE CAPSULE DAILY 30 MINUTES AFTER THE SAME MEAL EACH DAY, Disp: 30 capsule, Rfl: 11 .  zolpidem (AMBIEN) 5 MG tablet, TAKE 1 TABLET BY MOUTH AT BEDTIME, Disp: 30 tablet, Rfl: 5   Patient Care Team: Jerrol Banana., MD as PCP - General (Family Medicine) Jerrol Banana., MD (Family Medicine) Bary Castilla, Forest Gleason, MD (General Surgery)      Objective:   Vitals: BP 126/72 (BP Location: Left Arm, Patient Position: Sitting, Cuff Size: Large)   Pulse 82   Temp 97.6 F (36.4 C) (Oral)   Resp 16   Ht 6' (1.829 m)   Wt 290 lb (131.5 kg)   BMI 39.33 kg/m    Vitals:   05/01/17 1028  BP: 126/72  Pulse: 82  Resp: 16  Temp: 97.6 F (36.4 C)  TempSrc: Oral  Weight: 290 lb (131.5 kg)  Height: 6' (1.829 m)     Physical Exam  Constitutional: He is oriented to person, place, and time. He appears well-developed and well-nourished.  HENT:  Head: Normocephalic and atraumatic.  Right Ear: External ear normal.  Left Ear: External ear normal.  Nose: Nose normal.  Mouth/Throat: Oropharynx is clear and moist.  Eyes: Pupils are equal, round, and reactive to light. Conjunctivae and EOM are normal.    Neck: Normal range of motion. Neck supple.  Cardiovascular: Normal rate, regular rhythm, normal heart sounds and intact distal pulses.   Pulmonary/Chest: Effort normal and breath sounds normal.  Abdominal: Soft. Bowel sounds are normal.  Musculoskeletal: Normal range of motion.  Neurological: He is alert and oriented to person, place, and time. He has normal reflexes.  Skin: Skin is warm and dry.  Psychiatric: He has a normal mood and affect. His behavior is normal. Judgment and thought content normal.   Epworth is 4 today.  Depression Screen PHQ 2/9 Scores 05/01/2017 01/25/2016  PHQ - 2 Score 0 0  Assessment & Plan:     Routine Health Maintenance and Physical Exam  Exercise Activities and Dietary recommendations Goals    None      Immunization History  Administered Date(s) Administered  . Hepatitis A 11/22/2011  . Hepatitis A, Adult 07/29/2014  . Hepatitis B 11/22/2011  . Tdap 04/01/2010, 07/29/2014  . Typhoid Inactivated 07/29/2014    Health Maintenance  Topic Date Due  . HIV Screening  07/16/1966  . PNA vac Low Risk Adult (1 of 2 - PCV13) 07/16/2016  . INFLUENZA VACCINE  03/07/2017  . COLONOSCOPY  02/14/2022  . TETANUS/TDAP  07/29/2024  . Hepatitis C Screening  Completed      Discussed health benefits of physical activity, and encouraged him to engage in regular exercise appropriate for his age and condition.   influenza shot in Pneumovax given to 66 year old.   --------------------------------------------------------------------  I have done the exam and reviewed the chart and it is accurate to the best of my knowledge. Development worker, community has been used and  any errors in dictation or transcription are unintentional. Miguel Aschoff M.D. [place], MD  [facility]

## 2017-05-04 ENCOUNTER — Telehealth: Payer: Self-pay

## 2017-05-04 NOTE — Telephone Encounter (Signed)
Also I have patient's form that he needed [place] to fill out, need his waist circumference.-Wayne Goodwin V Ennis Delpozo, RMA

## 2017-05-04 NOTE — Telephone Encounter (Signed)
-----   Message from Jerrol Banana., MD sent at 05/04/2017 12:33 PM EDT ----- Stable--continue D and E.

## 2017-05-04 NOTE — Telephone Encounter (Signed)
[id number] 

## 2017-05-08 NOTE — Telephone Encounter (Signed)
I called patient to advise him as below. Patient states that someone else already called him about these results and advised hi about the form. Are you taking care of his form?

## 2017-05-08 NOTE — Telephone Encounter (Signed)
Thank you, there was no note about patient been advised about this. Thank you for checking on this. I do have the form on my desk. Did patient say if he was going to come by and get his waist measured for the form?-Wayne Goodwin V [name], RMA

## 2017-05-08 NOTE — Telephone Encounter (Signed)
Form faxed-Wendelin Reader Estell Harpin, RMA

## 2017-05-08 NOTE — Telephone Encounter (Signed)
Patient did not tell me that he was coming back in the office to have his waist measured. You may want to call him to verify that he is aware that this needs to be done before we fax the form. Thanks

## 2017-05-23 ENCOUNTER — Telehealth: Payer: Self-pay | Admitting: *Deleted

## 2017-05-23 MED ORDER — POLYETHYLENE GLYCOL 3350 17 GM/SCOOP PO POWD: ORAL | 0 refills | Status: DC

## 2017-05-23 NOTE — Telephone Encounter (Signed)
Patient was contacted today and medications were reviewed.   This patient reports that he has not picked up Miralax prescription and requests we send in a new prescription. Prescription sent to [facility] as requested.   We will proceed with colonoscopy as scheduled for 05-30-17 at [facility].   Patient was encouraged to call the office should he have further questions.

## 2017-05-28 ENCOUNTER — Other Ambulatory Visit: Payer: Self-pay | Admitting: Family Medicine

## 2017-05-30 ENCOUNTER — Ambulatory Visit: Payer: BLUE CROSS/BLUE SHIELD | Admitting: Anesthesiology

## 2017-05-30 ENCOUNTER — Encounter
Admission: RE | Disposition: A | Discharge status: Home / Self Care | Payer: Self-pay | Source: Ambulatory Visit | Attending: General Surgery

## 2017-05-30 ENCOUNTER — Ambulatory Visit
Admission: RE | Admit: 2017-05-30 | Discharge: 2017-05-30 | Disposition: A | Discharge status: Home / Self Care | Payer: BLUE CROSS/BLUE SHIELD | Source: Ambulatory Visit | Attending: General Surgery | Admitting: General Surgery

## 2017-05-30 DIAGNOSIS — M109 Gout, unspecified: Secondary | ICD-10-CM | POA: Insufficient documentation

## 2017-05-30 DIAGNOSIS — K5790 Diverticulosis of intestine, part unspecified, without perforation or abscess without bleeding: Secondary | ICD-10-CM | POA: Diagnosis not present

## 2017-05-30 DIAGNOSIS — Z1211 Encounter for screening for malignant neoplasm of colon: Secondary | ICD-10-CM | POA: Diagnosis not present

## 2017-05-30 DIAGNOSIS — Z79899 Other long term (current) drug therapy: Secondary | ICD-10-CM | POA: Diagnosis not present

## 2017-05-30 DIAGNOSIS — I10 Essential (primary) hypertension: Secondary | ICD-10-CM | POA: Insufficient documentation

## 2017-05-30 DIAGNOSIS — K635 Polyp of colon: Secondary | ICD-10-CM | POA: Insufficient documentation

## 2017-05-30 DIAGNOSIS — Z6836 Body mass index (BMI) 36.0-36.9, adult: Secondary | ICD-10-CM | POA: Diagnosis not present

## 2017-05-30 DIAGNOSIS — Z923 Personal history of irradiation: Secondary | ICD-10-CM | POA: Insufficient documentation

## 2017-05-30 DIAGNOSIS — Z8546 Personal history of malignant neoplasm of prostate: Secondary | ICD-10-CM | POA: Diagnosis not present

## 2017-05-30 DIAGNOSIS — Z7982 Long term (current) use of aspirin: Secondary | ICD-10-CM | POA: Diagnosis not present

## 2017-05-30 DIAGNOSIS — K5730 Diverticulosis of large intestine without perforation or abscess without bleeding: Secondary | ICD-10-CM | POA: Diagnosis not present

## 2017-05-30 DIAGNOSIS — E785 Hyperlipidemia, unspecified: Secondary | ICD-10-CM | POA: Insufficient documentation

## 2017-05-30 DIAGNOSIS — N400 Enlarged prostate without lower urinary tract symptoms: Secondary | ICD-10-CM | POA: Insufficient documentation

## 2017-05-30 DIAGNOSIS — D1772 Benign lipomatous neoplasm of other genitourinary organ: Secondary | ICD-10-CM | POA: Diagnosis not present

## 2017-05-30 DIAGNOSIS — D125 Benign neoplasm of sigmoid colon: Secondary | ICD-10-CM | POA: Diagnosis not present

## 2017-05-30 DIAGNOSIS — D175 Benign lipomatous neoplasm of intra-abdominal organs: Secondary | ICD-10-CM | POA: Diagnosis not present

## 2017-05-30 HISTORY — PX: COLONOSCOPY WITH PROPOFOL: SHX5780

## 2017-05-30 SURGERY — COLONOSCOPY WITH PROPOFOL
Anesthesia: General

## 2017-05-30 MED ORDER — PROPOFOL 10 MG/ML IV BOLUS
INTRAVENOUS | Status: DC | PRN
  Administered 2017-05-30: 30 mg via INTRAVENOUS
  Administered 2017-05-30: 40 mg via INTRAVENOUS
  Administered 2017-05-30: 20 mg via INTRAVENOUS

## 2017-05-30 MED ORDER — PROPOFOL 500 MG/50ML IV EMUL
INTRAVENOUS | Status: AC
  Filled 2017-05-30: qty 50

## 2017-05-30 MED ORDER — PROPOFOL 500 MG/50ML IV EMUL
INTRAVENOUS | Status: DC | PRN
  Administered 2017-05-30: 120 ug/kg/min via INTRAVENOUS

## 2017-05-30 MED ORDER — SODIUM CHLORIDE 0.9 % IV SOLN
INTRAVENOUS | Status: DC
  Administered 2017-05-30: 1000 mL via INTRAVENOUS

## 2017-05-30 NOTE — Transfer of Care (Signed)
Immediate Anesthesia Transfer of Care Note  Patient: Wayne Goodwin  Procedure(s) Performed: COLONOSCOPY WITH PROPOFOL (N/A )  Patient Location: PACU  Anesthesia Type:General  Level of Consciousness: awake  Airway & Oxygen Therapy: Patient Spontanous Breathing  Post-op Assessment: Report given to RN  Post vital signs: Reviewed  Last Vitals:  Vitals:   05/30/17 0825  BP: 133/81  Pulse: 93  Resp: 18  Temp: (!) 36.3 C  SpO2: 99%    Last Pain:  Vitals:   05/30/17 0825  TempSrc: Tympanic  PainSc: 8          Complications: No apparent anesthesia complications

## 2017-05-30 NOTE — Op Note (Signed)
[facility] Patient Name: Wayne Goodwin Procedure Date: 05/30/2017 8:44 AM MRN: [id number] Account #: [id number] Date of Birth: [birth year] Admit Type: Outpatient Age: 66 Room: [facility] ENDO ROOM 1 Gender: Male Note Status: Finalized Procedure:            Colonoscopy Providers:            Robert Bellow, MD Referring MD:         Janine Ores. Rosanna Randy, MD (Referring MD) Medicines:            Monitored Anesthesia Care Complications:        No immediate complications. Procedure:            Pre-Anesthesia Assessment:                       - Prior to the procedure, a History and Physical was                        performed, and patient medications, allergies and                        sensitivities were reviewed. The patient's tolerance of                        previous anesthesia was reviewed.                       - The risks and benefits of the procedure and the                        sedation options and risks were discussed with the                        patient. All questions were answered and informed                        consent was obtained.                       After obtaining informed consent, the colonoscope was                        passed under direct vision. Throughout the procedure,                        the patient's blood pressure, pulse, and oxygen                        saturations were monitored continuously. The Olympus                        CF-H180AL colonoscope ( S#: [id number] ) was introduced                        through the anus and advanced to the the cecum,                        identified by appendiceal orifice and ileocecal valve.                        The colonoscopy was performed without difficulty.  The                        patient tolerated the procedure well. The quality of                        the bowel preparation was excellent. Findings:      A few small and large-mouthed diverticula were found in the  sigmoid       colon, descending colon and proximal descending colon.      A 12 mm polyp was found in the mid sigmoid colon. The polyp was       pedunculated. The polyp was removed with a hot snare. Resection and       retrieval were complete.      There was a medium-sized lipoma, 15 mm in diameter, in the descending       colon. Impression:           - Diverticulosis in the sigmoid colon, in the                        descending colon and in the proximal descending colon.                       - One 12 mm polyp in the mid sigmoid colon, removed                        with a hot snare. Resected and retrieved.                       - Medium-sized lipoma in the descending colon. Recommendation:       - Telephone endoscopist for pathology results in 1 week. Procedure Code(s):    --- Professional ---                       [phone], Colonoscopy, flexible; with removal of tumor(s),                        polyp(s), or other lesion(s) by snare technique Diagnosis Code(s):    --- Professional ---                       D12.5, Benign neoplasm of sigmoid colon                       D17.5, Benign lipomatous neoplasm of intra-abdominal                        organs                       K57.30, Diverticulosis of large intestine without                        perforation or abscess without bleeding CPT copyright 2016 American Medical Association. All rights reserved. The codes documented in this report are preliminary and upon coder review may  be revised to meet current compliance requirements. Robert Bellow, MD 05/30/2017 9:13:44 AM This report has been signed electronically. Number of Addenda: 0 Note Initiated On: 05/30/2017 8:44 AM Scope Withdrawal Time: 0 hours 9 minutes 53 seconds  Total Procedure Duration: 0 hours 20 minutes 58  seconds       [facility]

## 2017-05-30 NOTE — H&P (Signed)
Wayne Goodwin [id number] 1951/08/02     HPI: 66 year old male for screening colonoscopy.  Tolerated prep well.  Recent flare of gout.  Prescriptions Prior to Admission  Medication Sig Dispense Refill Last Dose  . amLODipine-benazepril (LOTREL) 10-20 MG capsule TAKE ONE CAPSULE DAILY 30 capsule 11 05/29/2017 at Unknown time  . Bioflavonoid Products (BIOFLEX PO) Take 2 capsules by mouth daily.   Past Week at Unknown time  . indomethacin (INDOCIN) 50 MG capsule TAKE ONE CAPSULE THREE TIMES A DAY AS NEEDED FOR GOUT 90 capsule 11 Past Week at Unknown time  . Milk Thistle 250 MG CAPS Take 1 capsule by mouth daily.   Past Week at Unknown time  . Multiple Vitamin (MULTIVITAMIN) tablet Take 1 tablet by mouth daily.   Past Week at Unknown time  . phentermine 37.5 MG capsule Take 1 capsule (37.5 mg total) by mouth every morning. 30 capsule 2 Past Week at Unknown time  . polyethylene glycol powder (GLYCOLAX/MIRALAX) powder 255 grams one bottle for colonoscopy prep 255 g 0 Past Week at Unknown time  . rosuvastatin (CRESTOR) 10 MG tablet TAKE 1 TABLET EVERY DAY 30 tablet 5 05/29/2017 at Unknown time  . tamsulosin (FLOMAX) 0.4 MG CAPS capsule TAKE ONE CAPSULE DAILY 30 MINUTES AFTER THE SAME MEAL EACH DAY 30 capsule 11 Past Week at Unknown time  . zolpidem (AMBIEN) 5 MG tablet TAKE 1 TABLET BY MOUTH AT BEDTIME 30 tablet 5 05/29/2017 at Unknown time  . aspirin EC 81 MG tablet Take 81 mg by mouth.   Taking   No Known Allergies Past Medical History:  Diagnosis Date  . Arthritis   . BPH (benign prostatic hyperplasia)   . Cancer [facility]) 2006   prostate  . Complication of anesthesia    woke up during 2 procedures  . Dyspnea   . Hyperlipidemia   . Hypertension    Past Surgical History:  Procedure Laterality Date  . CATARACT EXTRACTION W/ INTRAOCULAR LENS  IMPLANT, BILATERAL    . COLONOSCOPY  2008   Tubular adenoma of the sigmoid colon, hyperplastic polyp 1. [name], M.D.  . COLONOSCOPY W/  BIOPSIES  2013   Benign mucosal biopsy, suspected left colon lipoma. [name], M.D.  . EYE SURGERY    . INSERTION PROSTATE RADIATION SEED  2015  . RETINAL DETACHMENT SURGERY Bilateral   . UMBILICAL HERNIA REPAIR N/A 01/18/2017   Procedure: HERNIA REPAIR UMBILICAL ADULT;  Surgeon: Robert Bellow, MD;  Location: [facility] ORS;  Service: General;  Laterality: N/A;  . VASECTOMY     Social History   Social History  . Marital status: Married    Spouse name: N/A  . Number of children: N/A  . Years of education: N/A   Occupational History  . Not on file.   Social History Main Topics  . Smoking status: Never Smoker  . Smokeless tobacco: Never Used  . Alcohol use 0.0 oz/week     Comment: 12 per week  . Drug use: No  . Sexual activity: Not on file   Other Topics Concern  . Not on file   Social History Narrative  . No narrative on file   Social History   Social History Narrative  . No narrative on file     ROS: Negative.     PE: HEENT: Negative. Lungs: Clear. Cardio: RR.  Assessment/Plan:  Proceed with planned endoscopy.  Robert Bellow 05/30/2017

## 2017-05-30 NOTE — Anesthesia Post-op Follow-up Note (Signed)
Anesthesia QCDR form completed.        

## 2017-05-30 NOTE — Anesthesia Preprocedure Evaluation (Signed)
Anesthesia Evaluation  Patient identified by MRN, date of birth, ID band Patient awake    Reviewed: Allergy & Precautions, NPO status , Patient's Chart, lab work & pertinent test results  Airway Mallampati: II       Dental  (+) Teeth Intact   Pulmonary shortness of breath and with exertion,     + decreased breath sounds      Cardiovascular Exercise Tolerance: Good hypertension, Pt. on medications  Rhythm:Regular     Neuro/Psych negative neurological ROS  negative psych ROS   GI/Hepatic negative GI ROS, Neg liver ROS,   Endo/Other  Morbid obesity  Renal/GU negative Renal ROS     Musculoskeletal   Abdominal (+) + obese,   Peds negative pediatric ROS (+)  Hematology negative hematology ROS (+)   Anesthesia Other Findings   Reproductive/Obstetrics                             Anesthesia Physical Anesthesia Plan  ASA: III  Anesthesia Plan: General   Post-op Pain Management:    Induction: Intravenous  PONV Risk Score and Plan: 0  Airway Management Planned: Natural Airway and Nasal Cannula  Additional Equipment:   Intra-op Plan:   Post-operative Plan:   Informed Consent: I have reviewed the patients History and Physical, chart, labs and discussed the procedure including the risks, benefits and alternatives for the proposed anesthesia with the patient or authorized representative who has indicated his/her understanding and acceptance.     Plan Discussed with: Surgeon  Anesthesia Plan Comments:         Anesthesia Quick Evaluation

## 2017-05-30 NOTE — Anesthesia Postprocedure Evaluation (Signed)
Anesthesia Post Note  Patient: Wayne Goodwin  Procedure(s) Performed: COLONOSCOPY WITH PROPOFOL (N/A )  Patient location during evaluation: PACU Anesthesia Type: General Level of consciousness: awake Pain management: pain level controlled Vital Signs Assessment: post-procedure vital signs reviewed and stable Respiratory status: spontaneous breathing Cardiovascular status: stable Anesthetic complications: no     Last Vitals:  Vitals:   05/30/17 0920 05/30/17 0930  BP: (!) 115/97 129/80  Pulse: 86 78  Resp: (!) 21 (!) 21  Temp:    SpO2: 96% 97%    Last Pain:  Vitals:   05/30/17 0910  TempSrc: Tympanic  PainSc:                  VAN STAVEREN,Riata Ikeda

## 2017-06-01 ENCOUNTER — Encounter: Payer: Self-pay | Admitting: General Surgery

## 2017-06-01 LAB — SURGICAL PATHOLOGY

## 2017-07-10 ENCOUNTER — Telehealth: Payer: Self-pay | Admitting: Family Medicine

## 2017-07-10 ENCOUNTER — Ambulatory Visit: Payer: BLUE CROSS/BLUE SHIELD | Admitting: Family Medicine

## 2017-07-10 VITALS — BP 114/78 | HR 82 | Temp 98.2°F | Resp 18 | Wt 280.2 lb

## 2017-07-10 DIAGNOSIS — G4700 Insomnia, unspecified: Secondary | ICD-10-CM

## 2017-07-10 DIAGNOSIS — R739 Hyperglycemia, unspecified: Secondary | ICD-10-CM

## 2017-07-10 DIAGNOSIS — E669 Obesity, unspecified: Secondary | ICD-10-CM | POA: Diagnosis not present

## 2017-07-10 DIAGNOSIS — I10 Essential (primary) hypertension: Secondary | ICD-10-CM

## 2017-07-10 DIAGNOSIS — M10041 Idiopathic gout, right hand: Secondary | ICD-10-CM

## 2017-07-10 MED ORDER — PHENTERMINE HCL 37.5 MG PO CAPS: 37.5000 mg | ORAL_CAPSULE | ORAL | 3 refills | Status: DC

## 2017-07-10 MED ORDER — COLCHICINE 0.6 MG PO TABS: 0.6000 mg | ORAL_TABLET | Freq: Every day | ORAL | 5 refills | Status: AC

## 2017-07-10 NOTE — Progress Notes (Signed)
Wayne Goodwin  MRN: [id number] DOB: 1950-08-27  Subjective:  HPI  Patient is here to follow up on weight management. Patient was last seen on 05/01/17 for CPE and was started on Phentermine. Patient is taking medication daily and is not having any side effects. From 05/01/17 to 05/30/17 he lost 20lbs. [facility], swimming exercises and decreaseing amount of food intake. He has not done as well the past 2 weeks due to gout attack in his right hand. Due to this he has not been able to swim and he is taking Indocin 3 times daily and has to make sure he eats good with the medication otherwise he feels sick on his stomach. Patient has not had an acute attack of gout since 2010 until now. Wt Readings from Last 3 Encounters:  07/10/17 280 lb 3.2 oz (127.1 kg)  05/30/17 270 lb (122.5 kg)  05/01/17 290 lb (131.5 kg)   Patient Active Problem List   Diagnosis Date Noted  . Hypercholesteremia 07/14/2015  . Gout 07/14/2015  . BP (high blood pressure) 07/14/2015  . Insomnia, persistent 07/14/2015  . 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 CA of prostate ([place]) 07/14/2015  . Detached retina 07/14/2015  . 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 Prostate cancer ([place]) 07/29/2014  . Essential hypertension 07/29/2014  . Hyperlipidemia 07/29/2014  . Obesity 01/30/2014  . Elevated prostate specific antigen (PSA) 11/14/2013  . Benign prostatic hyperplasia with urinary obstruction 11/14/2013    Past Medical History:  Diagnosis Date  . Arthritis   . BPH (benign prostatic hyperplasia)   . Cancer [facility]) 2006   prostate  . Complication of anesthesia    woke up during 2 procedures  . Dyspnea   . Hyperlipidemia   . Hypertension     Social History   Socioeconomic History  . Marital status: Married    Spouse name: Not on file  . Number of children: Not on file  . Years of education: Not on file  . Highest education level: Not on file  Social Needs  . Financial  resource strain: Not on file  . Food insecurity - worry: Not on file  . Food insecurity - inability: Not on file  . Transportation needs - medical: Not on file  . Transportation needs - non-medical: Not on file  Occupational History  . Not on file  Tobacco Use  . Smoking status: Never Smoker  . Smokeless tobacco: Never Used  Substance and Sexual Activity  . Alcohol use: Yes    Alcohol/week: 0.0 oz    Comment: 12 per week  . Drug use: No  . Sexual activity: Not on file  Other Topics Concern  . Not on file  Social History Narrative  . Not on file    Outpatient Encounter Medications as of 07/10/2017  Medication Sig Note  . amLODipine-benazepril (LOTREL) 10-20 MG capsule TAKE ONE CAPSULE DAILY   . aspirin EC 81 MG tablet Take 81 mg by mouth. 07/14/2015: Received from: [facility]  . Bioflavonoid Products (BIOFLEX PO) Take 2 capsules by mouth daily.   . indomethacin (INDOCIN) 50 MG capsule TAKE ONE CAPSULE THREE TIMES A DAY AS NEEDED FOR GOUT   . Milk Thistle 250 MG CAPS Take 1 capsule by mouth daily.   . Multiple Vitamin (MULTIVITAMIN) tablet Take 1 tablet by mouth daily.   . phentermine 37.5 MG capsule Take 1 capsule (37.5 mg total) by mouth every morning.   Marland Kitchen  rosuvastatin (CRESTOR) 10 MG tablet TAKE 1 TABLET EVERY DAY   . tamsulosin (FLOMAX) 0.4 MG CAPS capsule TAKE ONE CAPSULE DAILY 30 MINUTES AFTER THE SAME MEAL EACH DAY   . zolpidem (AMBIEN) 5 MG tablet TAKE 1 TABLET BY MOUTH AT BEDTIME    No facility-administered encounter medications on file as of 07/10/2017.     No Known Allergies  Review of Systems  Constitutional: Negative.   Respiratory: Negative.   Cardiovascular: Negative.   Gastrointestinal: Negative.   Musculoskeletal: Positive for joint pain (hand swelling).  Neurological: Negative.     Objective:  BP 114/78   Pulse 82   Temp 98.2 F (36.8 C)   Resp 18   Wt 280 lb 3.2 oz (127.1 kg)   BMI 38.00 kg/m   Physical Exam  Constitutional: He is oriented  to person, place, and time and well-developed, well-nourished, and in no distress.  HENT:  Head: Normocephalic and atraumatic.  Eyes: Conjunctivae are normal. Pupils are equal, round, and reactive to light.  Neck: Normal range of motion. Neck supple. No thyromegaly present.  Cardiovascular: Normal rate, regular rhythm, normal heart sounds and intact distal pulses. Exam reveals no gallop.  No murmur heard. Pulmonary/Chest: Effort normal and breath sounds normal. No respiratory distress. He has no wheezes.  Musculoskeletal:       Right hand: He exhibits decreased range of motion and swelling. Decreased strength noted.  Neurological: He is alert and oriented to person, place, and time.  Psychiatric: Mood, memory, affect and judgment normal.   Assessment and Plan :  1. Obesity (BMI 35.0-39.9 without comorbidity) Doing well. Had a set back due to gout at this time. Continue working on habits. Refill provided. - phentermine 37.5 MG capsule; Take 1 capsule (37.5 mg total) by mouth every morning.  Dispense: 30 capsule; Refill: 3  2. Acute idiopathic gout of right hand Check Uric Acid. Last flare up was in 2010-acute gout. Start Colchicine. Decrease Indocin to once daily only for now. - colchicine 0.6 MG tablet; Take 1 tablet (0.6 mg total) by mouth daily.  Dispense: 30 tablet; Refill: 5 - Uric acid  3. Insomnia, persistent 4. Essential hypertension Stable. 5. Hyperglycemia elevated glucose level on last check will check A1C at this time. - HgB A1c  HPI, Exam and A&P transcribed by Tiffany Kocher, RMA under direction and in the presence of Miguel Aschoff, MD. I have done the exam and reviewed the chart and it is accurate to the best of my knowledge. Development worker, community has been used and  any errors in dictation or transcription are unintentional. Miguel Aschoff M.D. [facility]

## 2017-07-10 NOTE — Telephone Encounter (Signed)
Please advise-Isacc Turney V Shantice Menger, RMA  

## 2017-07-10 NOTE — Patient Instructions (Signed)
Cut back to taking Indocin once daily.

## 2017-07-10 NOTE — Telephone Encounter (Signed)
Pt wanted to let Dr Rosanna Randy know the he is taking Uric Acid control by [employer] and Uric Acid flush made by [employer].  Pt wants to make sure this is ok to take with his other medications.  CB#[phone]/MW

## 2017-07-11 LAB — HEMOGLOBIN A1C
HEMOGLOBIN A1C: 6.4 %{Hb} — AB (ref <5.7)
Mean Plasma Glucose: 137 (calc)
eAG (mmol/L): 7.6 (calc)

## 2017-07-11 LAB — URIC ACID: URIC ACID, SERUM: 8.2 mg/dL — AB (ref 4.0–8.0)

## 2017-07-12 NOTE — Telephone Encounter (Signed)
Pt advised-Wayne Goodwin, RMA  

## 2017-07-12 NOTE — Telephone Encounter (Signed)
ok 

## 2017-07-23 ENCOUNTER — Telehealth: Payer: Self-pay

## 2017-07-23 NOTE — Telephone Encounter (Signed)
Patient advised, patient would like to try Allopurinol. What dose to start with?-Meria Crilly V Danuta Huseman, RMA

## 2017-07-23 NOTE — Telephone Encounter (Signed)
Sent call to ana

## 2017-07-24 ENCOUNTER — Other Ambulatory Visit: Payer: Self-pay

## 2017-07-24 MED ORDER — ALLOPURINOL 100 MG PO TABS: 100.0000 mg | ORAL_TABLET | Freq: Every day | ORAL | 12 refills | Status: DC

## 2017-07-24 NOTE — Telephone Encounter (Signed)
100mg  daily.RTC 1-2 months.

## 2017-07-30 ENCOUNTER — Other Ambulatory Visit: Payer: Self-pay | Admitting: Family Medicine

## 2017-08-03 ENCOUNTER — Other Ambulatory Visit: Payer: Self-pay | Admitting: Family Medicine

## 2017-08-06 ENCOUNTER — Other Ambulatory Visit: Payer: Self-pay | Admitting: Family Medicine

## 2017-09-03 ENCOUNTER — Other Ambulatory Visit: Payer: Self-pay

## 2017-09-03 DIAGNOSIS — E669 Obesity, unspecified: Secondary | ICD-10-CM

## 2017-09-03 MED ORDER — PHENTERMINE HCL 37.5 MG PO CAPS: 37.5000 mg | ORAL_CAPSULE | ORAL | 3 refills | Status: DC

## 2017-09-03 NOTE — Telephone Encounter (Signed)
Fax from [facility]- need refill on Phentermine, patient had 3 refills left at [facility] before [facility] took records and he is switching to [facility] now. Please Mackie Pai, RMA

## 2017-09-19 ENCOUNTER — Other Ambulatory Visit: Payer: Self-pay | Admitting: Family Medicine

## 2017-09-25 ENCOUNTER — Other Ambulatory Visit: Payer: Self-pay | Admitting: Family Medicine

## 2017-09-25 NOTE — Telephone Encounter (Signed)
[facility] faxed a refill request for the following medications. Thanks CC  rosuvastatin (CRESTOR) 10 MG tablet   tamsulosin (FLOMAX) 0.4 MG CAPS capsule   amLODipine-benazepril (LOTREL) 10-20 MG capsule

## 2017-09-26 MED ORDER — AMLODIPINE BESY-BENAZEPRIL HCL 10-20 MG PO CAPS: 1.0000 | ORAL_CAPSULE | Freq: Every day | ORAL | 11 refills | Status: DC

## 2017-09-26 MED ORDER — TAMSULOSIN HCL 0.4 MG PO CAPS: 0.4000 mg | ORAL_CAPSULE | Freq: Every day | ORAL | 11 refills | Status: DC

## 2017-09-26 MED ORDER — ROSUVASTATIN CALCIUM 10 MG PO TABS: 10.0000 mg | ORAL_TABLET | Freq: Every day | ORAL | 11 refills | Status: DC

## 2017-11-05 ENCOUNTER — Other Ambulatory Visit: Payer: Self-pay | Admitting: Family Medicine

## 2017-11-05 DIAGNOSIS — M10041 Idiopathic gout, right hand: Secondary | ICD-10-CM

## 2017-11-05 DIAGNOSIS — E7800 Pure hypercholesterolemia, unspecified: Secondary | ICD-10-CM

## 2017-11-05 MED ORDER — ALLOPURINOL 100 MG PO TABS: 100.0000 mg | ORAL_TABLET | Freq: Every day | ORAL | 3 refills | Status: DC

## 2017-11-05 MED ORDER — ROSUVASTATIN CALCIUM 10 MG PO TABS: 10.0000 mg | ORAL_TABLET | Freq: Every day | ORAL | 3 refills | Status: DC

## 2017-11-05 NOTE — Telephone Encounter (Signed)
Sent!

## 2017-11-05 NOTE — Telephone Encounter (Signed)
[facility] a refill request for the following medications. Thanks CC  allopurinol (ZYLOPRIM) 100 MG tablet   rosuvastatin (CRESTOR) 10 MG tablet

## 2017-11-07 ENCOUNTER — Ambulatory Visit: Payer: BLUE CROSS/BLUE SHIELD | Admitting: Family Medicine

## 2017-11-07 ENCOUNTER — Encounter: Payer: Self-pay | Admitting: Family Medicine

## 2017-11-07 VITALS — BP 118/78 | HR 102 | Temp 98.8°F | Resp 18 | Wt 276.0 lb

## 2017-11-07 DIAGNOSIS — M10041 Idiopathic gout, right hand: Secondary | ICD-10-CM | POA: Diagnosis not present

## 2017-11-07 DIAGNOSIS — E669 Obesity, unspecified: Secondary | ICD-10-CM

## 2017-11-07 DIAGNOSIS — R739 Hyperglycemia, unspecified: Secondary | ICD-10-CM | POA: Diagnosis not present

## 2017-11-07 DIAGNOSIS — I10 Essential (primary) hypertension: Secondary | ICD-10-CM | POA: Diagnosis not present

## 2017-11-07 NOTE — Progress Notes (Signed)
Patient: Wayne Goodwin Male    DOB: Dec 31, 1950   67 y.o.   MRN: [id number] Visit Date: 11/07/2017  Today's Provider: Wilhemena Durie, MD   Chief Complaint  Patient presents with  . Obesity  . Gout   Subjective:    HPI Pt is her today for follow up of weight and gout.   Obesity- He reports that he has been feeling well. His weight last OV was 280 lb and today is 276 lbs. He reports that he is swimming/water aerobics 5 days a week.   Gout- He was started on allopurinol on 07/10/17 due to uric acid levels being elevated. Pt reports that he is doing well on this, no side effects. He is off of the colchicine and indomethacin. Has not had any further gout flares.        No Known Allergies   Current Outpatient Medications:  .  allopurinol (ZYLOPRIM) 100 MG tablet, Take 1 tablet (100 mg total) by mouth daily., Disp: 90 tablet, Rfl: 3 .  amLODipine-benazepril (LOTREL) 10-20 MG capsule, Take 1 capsule by mouth daily., Disp: 30 capsule, Rfl: 11 .  aspirin EC 81 MG tablet, Take 81 mg by mouth., Disp: , Rfl:  .  Bioflavonoid Products (BIOFLEX PO), Take 2 capsules by mouth daily., Disp: , Rfl:  .  Milk Thistle 250 MG CAPS, Take 1 capsule by mouth daily., Disp: , Rfl:  .  Multiple Vitamin (MULTIVITAMIN) tablet, Take 1 tablet by mouth daily., Disp: , Rfl:  .  phentermine 37.5 MG capsule, Take 1 capsule (37.5 mg total) by mouth every morning., Disp: 30 capsule, Rfl: 3 .  rosuvastatin (CRESTOR) 10 MG tablet, Take 1 tablet (10 mg total) by mouth daily., Disp: 90 tablet, Rfl: 3 .  tamsulosin (FLOMAX) 0.4 MG CAPS capsule, Take 1 capsule (0.4 mg total) by mouth daily., Disp: 30 capsule, Rfl: 11 .  zolpidem (AMBIEN) 5 MG tablet, TAKE 1 TABLET BY MOUTH AT BEDTIME, Disp: 30 tablet, Rfl: 5 .  colchicine 0.6 MG tablet, Take 1 tablet (0.6 mg total) by mouth daily. (Patient not taking: Reported on 11/07/2017), Disp: 30 tablet, Rfl: 5 .  indomethacin (INDOCIN) 50 MG capsule, TAKE ONE CAPSULE THREE  TIMES A DAY AS NEEDED FOR GOUT (Patient not taking: Reported on 11/07/2017), Disp: 90 capsule, Rfl: 11  Review of Systems  Constitutional: Negative.   HENT: Negative.   Eyes: Negative.   Respiratory: Negative.   Cardiovascular: Negative.   Gastrointestinal: Negative.   Endocrine: Negative.   Genitourinary: Negative.   Musculoskeletal: Positive for arthralgias.  Skin: Negative.   Allergic/Immunologic: Negative.   Neurological: Negative.   Hematological: Negative.   Psychiatric/Behavioral: Negative.     Social History   Tobacco Use  . Smoking status: Never Smoker  . Smokeless tobacco: Never Used  Substance Use Topics  . Alcohol use: Yes    Alcohol/week: 0.0 oz    Comment: 12 per week   Objective:   BP 118/78 (BP Location: Left Arm, Patient Position: Sitting, Cuff Size: Large)   Pulse (!) 102   Temp 98.8 F (37.1 C) (Oral)   Resp 18   Wt 276 lb (125.2 kg)   BMI 37.43 kg/m  Vitals:   11/07/17 0828  BP: 118/78  Pulse: (!) 102  Resp: 18  Temp: 98.8 F (37.1 C)  TempSrc: Oral  Weight: 276 lb (125.2 kg)     Physical Exam  Constitutional: He is oriented to person, place, and time. He  appears well-developed.  HENT:  Head: Normocephalic and atraumatic.  Eyes: Conjunctivae are normal. No scleral icterus.  Neck: No thyromegaly present.  Cardiovascular: Normal rate, regular rhythm and normal heart sounds.  Pulmonary/Chest: Effort normal and breath sounds normal.  Abdominal: Soft.  Musculoskeletal:  Trace pedal edema   Neurological: He is alert and oriented to person, place, and time.  Skin: Skin is warm and dry.  Psychiatric: He has a normal mood and affect. His behavior is normal. Judgment and thought content normal.        Assessment & Plan:     1. Obesity (BMI 35.0-39.9 without comorbidity)   2. Acute idiopathic gout of right hand  - Uric acid  3. Hyperglycemia  - Hemoglobin A1c  4. Essential hypertension       I have done the exam and  reviewed the above chart and it is accurate to the best of my knowledge. Development worker, community has been used in this note in any air is in the dictation or transcription are unintentional.  Wilhemena Durie, MD  [place]

## 2017-11-08 ENCOUNTER — Telehealth: Payer: Self-pay | Admitting: Emergency Medicine

## 2017-11-08 DIAGNOSIS — M10041 Idiopathic gout, right hand: Secondary | ICD-10-CM

## 2017-11-08 LAB — HEMOGLOBIN A1C
Est. average glucose Bld gHb Est-mCnc: 137 mg/dL
Hgb A1c MFr Bld: 6.4 % — ABNORMAL HIGH (ref 4.8–5.6)

## 2017-11-08 LAB — URIC ACID: Uric Acid: 7.7 mg/dL (ref 3.7–8.6)

## 2017-11-08 NOTE — Telephone Encounter (Signed)
-----   Message from Jerrol Banana., MD sent at 11/08/2017  9:52 AM EDT ----- Sugar stable--increase Allopurinol from 100 to 300mg  daily to try to control gout issue.

## 2017-11-08 NOTE — Telephone Encounter (Signed)
LMTCB

## 2017-11-09 MED ORDER — ALLOPURINOL 300 MG PO TABS: 300.0000 mg | ORAL_TABLET | Freq: Every day | ORAL | 11 refills | Status: DC

## 2017-11-09 NOTE — Telephone Encounter (Signed)
Pt advised. Med sent in.

## 2017-12-07 ENCOUNTER — Other Ambulatory Visit: Payer: Self-pay | Admitting: Family Medicine

## 2018-01-03 ENCOUNTER — Ambulatory Visit (INDEPENDENT_AMBULATORY_CARE_PROVIDER_SITE_OTHER): Payer: BLUE CROSS/BLUE SHIELD | Admitting: Ophthalmology

## 2018-01-09 ENCOUNTER — Other Ambulatory Visit: Payer: Self-pay | Admitting: Family Medicine

## 2018-01-31 ENCOUNTER — Encounter (INDEPENDENT_AMBULATORY_CARE_PROVIDER_SITE_OTHER): Payer: BLUE CROSS/BLUE SHIELD | Admitting: Ophthalmology

## 2018-02-20 ENCOUNTER — Encounter (INDEPENDENT_AMBULATORY_CARE_PROVIDER_SITE_OTHER): Payer: BLUE CROSS/BLUE SHIELD | Admitting: Ophthalmology

## 2018-02-25 ENCOUNTER — Other Ambulatory Visit: Payer: Self-pay | Admitting: Family Medicine

## 2018-03-12 ENCOUNTER — Encounter (INDEPENDENT_AMBULATORY_CARE_PROVIDER_SITE_OTHER): Payer: BLUE CROSS/BLUE SHIELD | Admitting: Ophthalmology

## 2018-03-12 DIAGNOSIS — H35373 Puckering of macula, bilateral: Secondary | ICD-10-CM

## 2018-03-12 DIAGNOSIS — H338 Other retinal detachments: Secondary | ICD-10-CM | POA: Diagnosis not present

## 2018-03-12 DIAGNOSIS — I10 Essential (primary) hypertension: Secondary | ICD-10-CM | POA: Diagnosis not present

## 2018-03-12 DIAGNOSIS — H35033 Hypertensive retinopathy, bilateral: Secondary | ICD-10-CM | POA: Diagnosis not present

## 2018-03-12 DIAGNOSIS — H43813 Vitreous degeneration, bilateral: Secondary | ICD-10-CM

## 2018-05-07 ENCOUNTER — Encounter: Payer: Self-pay | Admitting: Family Medicine

## 2018-05-08 ENCOUNTER — Encounter: Payer: Self-pay | Admitting: Family Medicine

## 2018-07-09 ENCOUNTER — Other Ambulatory Visit: Payer: Self-pay | Admitting: Family Medicine

## 2018-09-10 ENCOUNTER — Encounter: Payer: Self-pay | Admitting: Family Medicine

## 2018-09-10 ENCOUNTER — Other Ambulatory Visit: Payer: Self-pay | Admitting: Family Medicine

## 2018-09-10 ENCOUNTER — Ambulatory Visit (INDEPENDENT_AMBULATORY_CARE_PROVIDER_SITE_OTHER): Payer: BLUE CROSS/BLUE SHIELD | Admitting: Family Medicine

## 2018-09-10 VITALS — BP 120/78 | HR 81 | Temp 98.6°F | Ht 72.0 in | Wt 284.8 lb

## 2018-09-10 DIAGNOSIS — I10 Essential (primary) hypertension: Secondary | ICD-10-CM | POA: Diagnosis not present

## 2018-09-10 DIAGNOSIS — C61 Malignant neoplasm of prostate: Secondary | ICD-10-CM | POA: Diagnosis not present

## 2018-09-10 DIAGNOSIS — M2550 Pain in unspecified joint: Secondary | ICD-10-CM

## 2018-09-10 DIAGNOSIS — E7800 Pure hypercholesterolemia, unspecified: Secondary | ICD-10-CM

## 2018-09-10 DIAGNOSIS — Z0000 Encounter for general adult medical examination without abnormal findings: Secondary | ICD-10-CM

## 2018-09-10 DIAGNOSIS — Z6838 Body mass index (BMI) 38.0-38.9, adult: Secondary | ICD-10-CM

## 2018-09-10 NOTE — Progress Notes (Signed)
Patient: Wayne Goodwin, Male    DOB: 02-23-1951, 68 y.o.   MRN: [id number] Visit Date: 09/10/2018  Today's Provider: Wilhemena Durie, MD   Chief Complaint  Patient presents with  . Annual Exam   Subjective:     Complete Physical Wayne Goodwin is a 68 y.o. male. He feels fairly well. He reports exercising water aerobics daily. He reports he is sleeping well.  ----------------------------------------------------------- Last colonoscopy: 05/30/2017  Review of Systems  Constitutional: Negative.   HENT: Negative.   Eyes: Negative.   Respiratory: Negative.   Cardiovascular: Negative.   Gastrointestinal: Negative.   Endocrine: Negative.   Genitourinary: Negative.   Musculoskeletal: Negative.   Skin: Negative.   Allergic/Immunologic: Negative.   Neurological: Negative.   Hematological: Negative.   Psychiatric/Behavioral: Negative.     Social History   Socioeconomic History  . Marital status: Married    Spouse name: Not on file  . Number of children: Not on file  . Years of education: Not on file  . Highest education level: Not on file  Occupational History  . Not on file  Social Needs  . Financial resource strain: Not on file  . Food insecurity:    Worry: Not on file    Inability: Not on file  . Transportation needs:    Medical: Not on file    Non-medical: Not on file  Tobacco Use  . Smoking status: Never Smoker  . Smokeless tobacco: Never Used  Substance and Sexual Activity  . Alcohol use: Yes    Alcohol/week: 0.0 standard drinks    Comment: 12 per week  . Drug use: No  . Sexual activity: Not on file  Lifestyle  . Physical activity:    Days per week: Not on file    Minutes per session: Not on file  . Stress: Not on file  Relationships  . Social connections:    Talks on phone: Not on file    Gets together: Not on file    Attends religious service: Not on file    Active member of club or organization: Not on file    Attends meetings of clubs  or organizations: Not on file    Relationship status: Not on file  . Intimate partner violence:    Fear of current or ex partner: Not on file    Emotionally abused: Not on file    Physically abused: Not on file    Forced sexual activity: Not on file  Other Topics Concern  . Not on file  Social History Narrative  . Not on file    Past Medical History:  Diagnosis Date  . Arthritis   . BPH (benign prostatic hyperplasia)   . Cancer [facility]) 2006   prostate  . Complication of anesthesia    woke up during 2 procedures  . Dyspnea   . Hyperlipidemia   . Hypertension      Patient Active Problem List   Diagnosis Date Noted  . Hypercholesteremia 07/14/2015  . Gout 07/14/2015  . BP (high blood pressure) 07/14/2015  . Insomnia, persistent 07/14/2015  . 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 CA of prostate ([place]) 07/14/2015  . Detached retina 07/14/2015  . 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 Prostate cancer ([place]) 07/29/2014  . Essential hypertension 07/29/2014  . Hyperlipidemia 07/29/2014  . Obesity 01/30/2014  . Elevated prostate specific antigen (PSA) 11/14/2013  . Benign prostatic hyperplasia with urinary obstruction 11/14/2013    Past Surgical History:  Procedure Laterality Date  . CATARACT EXTRACTION W/ INTRAOCULAR LENS  IMPLANT, BILATERAL    . COLONOSCOPY  2008   Tubular adenoma of the sigmoid colon, hyperplastic polyp 1. [name], M.D.  . COLONOSCOPY W/ BIOPSIES  2013   Benign mucosal biopsy, suspected left colon lipoma. [name], M.D.  . COLONOSCOPY WITH PROPOFOL N/A 05/30/2017   Procedure: COLONOSCOPY WITH PROPOFOL;  Surgeon: Robert Bellow, MD;  Location: [facility] ENDOSCOPY;  Service: Endoscopy;  Laterality: N/A;  . EYE SURGERY    . INSERTION PROSTATE RADIATION SEED  2015  . RETINAL DETACHMENT SURGERY Bilateral   . UMBILICAL HERNIA REPAIR N/A 01/18/2017   Procedure: HERNIA REPAIR UMBILICAL ADULT;  Surgeon: Robert Bellow, MD;  Location: [facility] ORS;   Service: General;  Laterality: N/A;  . VASECTOMY      His family history includes Breast cancer in his mother; Cancer in his father; Gout in his father; Hypertension in his father and mother.   Current Outpatient Medications:  .  allopurinol (ZYLOPRIM) 300 MG tablet, Take 1 tablet (300 mg total) by mouth daily., Disp: 30 tablet, Rfl: 11 .  amLODipine-benazepril (LOTREL) 10-20 MG capsule, Take 1 capsule by mouth daily., Disp: 30 capsule, Rfl: 11 .  aspirin EC 81 MG tablet, Take 81 mg by mouth., Disp: , Rfl:  .  Bioflavonoid Products (BIOFLEX PO), Take 2 capsules by mouth daily., Disp: , Rfl:  .  Milk Thistle 250 MG CAPS, Take 1 capsule by mouth daily., Disp: , Rfl:  .  Multiple Vitamin (MULTIVITAMIN) tablet, Take 1 tablet by mouth daily., Disp: , Rfl:  .  phentermine 37.5 MG capsule, Take 1 capsule (37.5 mg total) by mouth every morning., Disp: 30 capsule, Rfl: 3 .  rosuvastatin (CRESTOR) 10 MG tablet, Take 1 tablet (10 mg total) by mouth daily., Disp: 90 tablet, Rfl: 3 .  tamsulosin (FLOMAX) 0.4 MG CAPS capsule, Take 1 capsule (0.4 mg total) by mouth daily., Disp: 30 capsule, Rfl: 11 .  zolpidem (AMBIEN) 5 MG tablet, TAKE 1 TABLET BY MOUTH AT BEDTIME, Disp: 30 tablet, Rfl: 5 .  colchicine 0.6 MG tablet, Take 1 tablet (0.6 mg total) by mouth daily. (Patient not taking: Reported on 11/07/2017), Disp: 30 tablet, Rfl: 5 .  indomethacin (INDOCIN) 50 MG capsule, TAKE ONE CAPSULE THREE TIMES A DAY AS NEEDED FOR GOUT (Patient not taking: Reported on 11/07/2017), Disp: 90 capsule, Rfl: 11  Patient Care Team: Jerrol Banana., MD as PCP - General (Family Medicine) Jerrol Banana., MD (Family Medicine) Bary Castilla, Forest Gleason, MD (General Surgery)     Objective:    Vitals: BP 120/78 (BP Location: Right Arm, Patient Position: Sitting, Cuff Size: Large)   Pulse 81   Temp 98.6 F (37 C) (Oral)   Ht 6' (1.829 m)   Wt 284 lb 12.8 oz (129.2 kg)   SpO2 97%   BMI 38.63 kg/m   Physical  Exam Constitutional:      Appearance: He is well-developed. He is obese.  HENT:     Head: Normocephalic and atraumatic.     Right Ear: External ear normal.     Left Ear: External ear normal.     Nose: Nose normal.     Mouth/Throat:     Pharynx: Oropharynx is clear.  Eyes:     General: No scleral icterus.    Conjunctiva/sclera: Conjunctivae normal.     Pupils: Pupils are equal, round, and reactive to light.  Neck:     Musculoskeletal:  Normal range of motion and neck supple.  Cardiovascular:     Rate and Rhythm: Normal rate and regular rhythm.     Heart sounds: Normal heart sounds.  Pulmonary:     Effort: Pulmonary effort is normal.     Breath sounds: Normal breath sounds.  Abdominal:     General: Bowel sounds are normal.     Palpations: Abdomen is soft.  Genitourinary:    Penis: Normal.   Musculoskeletal: Normal range of motion.  Skin:    General: Skin is warm and dry.  Neurological:     General: No focal deficit present.     Mental Status: He is alert and oriented to person, place, and time. Mental status is at baseline.     Deep Tendon Reflexes: Reflexes are normal and symmetric.  Psychiatric:        Mood and Affect: Mood normal.        Behavior: Behavior normal.        Thought Content: Thought content normal.        Judgment: Judgment normal.     Activities of Daily Living In your present state of health, do you have any difficulty performing the following activities: 09/10/2018  Hearing? N  Vision? N  Difficulty concentrating or making decisions? N  Walking or climbing stairs? N  Dressing or bathing? N  Doing errands, shopping? N  Some recent data might be hidden    Fall Risk Assessment Fall Risk  09/10/2018 05/01/2017 01/25/2016  Falls in the past year? 0 Yes Yes  Comment - - tripped over dog in the dark  Number falls in past yr: - 1 1  Injury with Fall? - Yes No  Follow up - Education provided -     Depression Screen PHQ 2/9 Scores 09/10/2018 07/10/2017  05/01/2017 01/25/2016  PHQ - 2 Score 0 0 0 0  PHQ- 9 Score 0 0 - -    No flowsheet data found.     Assessment & Plan:    Annual Physical Reviewed patient's Family Medical History Reviewed and updated list of patient's medical providers Assessment of cognitive impairment was done Assessed patient's functional ability Established a written schedule for health screening [place] Completed and Reviewed  Exercise Activities and Dietary recommendations Goals   None     Immunization History  Administered Date(s) Administered  . Hepatitis A 11/22/2011  . Hepatitis A, Adult 07/29/2014  . Hepatitis B 11/22/2011  . Influenza, High Dose Seasonal PF 05/01/2017  . Influenza-Unspecified 06/13/2018  . Pneumococcal Conjugate-13 05/01/2017  . Tdap 04/01/2010, 07/29/2014  . Typhoid Inactivated 07/29/2014    Health Maintenance  Topic Date Due  . PNA vac Low Risk Adult (2 of 2 - PPSV23) 05/01/2018  . TETANUS/TDAP  07/29/2024  . COLONOSCOPY  05/31/2027  . INFLUENZA VACCINE  Completed  . Hepatitis C Screening  Completed     Discussed health benefits of physical activity, and encouraged him to engage in regular exercise appropriate for his age and condition.  1. Annual physical exam Lifestyle stressed. - TSH - CBC with Differential - Lipid Profile - Comprehensive Metabolic Panel (CMET) RTC 1 year. 2. Prostate cancer ([place])  - PSA  3. Arthralgia, unspecified joint Patient markedly improved due to CBD oil.  After discussion we will do a drug screen out of curiosity. - Drug Screen, Urine 4. Essential hypertension   5. Hypercholesteremia   6. Class 2 severe obesity due to excess calories with serious comorbidity and body mass  index (BMI) of 38.0 to 38.9 in adult ([place])  7.Insomnia   ------------------------------------------------------------------------------------------------------------   I have done the exam and reviewed the above chart and it is  accurate to the best of my knowledge. Development worker, community has been used in this note in any air is in the dictation or transcription are unintentional.  Wilhemena Durie, MD  [place]

## 2018-09-11 LAB — COMPREHENSIVE METABOLIC PANEL
A/G RATIO: 2.4 — AB (ref 1.2–2.2)
ALT: 46 IU/L — ABNORMAL HIGH (ref 0–44)
AST: 45 IU/L — ABNORMAL HIGH (ref 0–40)
Albumin: 4.8 g/dL (ref 3.8–4.8)
Alkaline Phosphatase: 82 IU/L (ref 39–117)
BILIRUBIN TOTAL: 0.4 mg/dL (ref 0.0–1.2)
BUN/Creatinine Ratio: 17 (ref 10–24)
BUN: 17 mg/dL (ref 8–27)
CALCIUM: 9.9 mg/dL (ref 8.6–10.2)
CHLORIDE: 102 mmol/L (ref 96–106)
CO2: 19 mmol/L — ABNORMAL LOW (ref 20–29)
Creatinine, Ser: 1.01 mg/dL (ref 0.76–1.27)
GFR calc Af Amer: 89 mL/min/{1.73_m2} (ref >59)
GFR, EST NON AFRICAN AMERICAN: 77 mL/min/{1.73_m2} (ref >59)
GLOBULIN, TOTAL: 2 g/dL (ref 1.5–4.5)
Glucose: 118 mg/dL — ABNORMAL HIGH (ref 65–99)
POTASSIUM: 4.1 mmol/L (ref 3.5–5.2)
SODIUM: 140 mmol/L (ref 134–144)
Total Protein: 6.8 g/dL (ref 6.0–8.5)

## 2018-09-11 LAB — CBC WITH DIFFERENTIAL/PLATELET
BASOS: 0 %
Basophils Absolute: 0 10*3/uL (ref 0.0–0.2)
EOS (ABSOLUTE): 0.4 10*3/uL (ref 0.0–0.4)
EOS: 5 %
HEMATOCRIT: 41.7 % (ref 37.5–51.0)
Hemoglobin: 14.1 g/dL (ref 13.0–17.7)
IMMATURE GRANULOCYTES: 0 %
Immature Grans (Abs): 0 10*3/uL (ref 0.0–0.1)
LYMPHS ABS: 2 10*3/uL (ref 0.7–3.1)
Lymphs: 26 %
MCH: 31.1 pg (ref 26.6–33.0)
MCHC: 33.8 g/dL (ref 31.5–35.7)
MCV: 92 fL (ref 79–97)
MONOS ABS: 0.8 10*3/uL (ref 0.1–0.9)
Monocytes: 10 %
NEUTROS ABS: 4.5 10*3/uL (ref 1.4–7.0)
NEUTROS PCT: 59 %
Platelets: 194 10*3/uL (ref 150–450)
RBC: 4.54 x10E6/uL (ref 4.14–5.80)
RDW: 12.9 % (ref 11.6–15.4)
WBC: 7.7 10*3/uL (ref 3.4–10.8)

## 2018-09-11 LAB — DRUG SCREEN, URINE
Amphetamines, Urine: NEGATIVE ng/mL
BARBITURATE SCREEN URINE: NEGATIVE ng/mL
BENZODIAZEPINE QUANT UR: NEGATIVE ng/mL
CANNABINOID QUANT UR: POSITIVE ng/mL — AB
COCAINE (METAB.): NEGATIVE ng/mL
Opiate Quant, Ur: NEGATIVE ng/mL
PCP QUANT UR: NEGATIVE ng/mL

## 2018-09-11 LAB — LIPID PANEL
CHOL/HDL RATIO: 3.2 ratio (ref 0.0–5.0)
Cholesterol, Total: 122 mg/dL (ref 100–199)
HDL: 38 mg/dL — ABNORMAL LOW (ref >39)
LDL Calculated: 46 mg/dL (ref 0–99)
Triglycerides: 190 mg/dL — ABNORMAL HIGH (ref 0–149)
VLDL Cholesterol Cal: 38 mg/dL (ref 5–40)

## 2018-09-11 LAB — TSH: TSH: 4.27 u[IU]/mL (ref 0.450–4.500)

## 2018-09-11 LAB — PSA: Prostate Specific Ag, Serum: 0.6 ng/mL (ref 0.0–4.0)

## 2018-09-13 ENCOUNTER — Other Ambulatory Visit: Payer: Self-pay | Admitting: Family Medicine

## 2018-09-16 ENCOUNTER — Other Ambulatory Visit: Payer: Self-pay | Admitting: Family Medicine

## 2018-09-16 DIAGNOSIS — E7800 Pure hypercholesterolemia, unspecified: Secondary | ICD-10-CM

## 2018-09-16 NOTE — Telephone Encounter (Signed)
Pharmacy requesting refills. Thanks!  

## 2018-09-17 ENCOUNTER — Encounter: Payer: Self-pay | Admitting: Family Medicine

## 2018-09-17 ENCOUNTER — Other Ambulatory Visit: Payer: Self-pay | Admitting: Family Medicine

## 2018-11-14 ENCOUNTER — Other Ambulatory Visit: Payer: Self-pay | Admitting: Family Medicine

## 2018-11-14 DIAGNOSIS — M10041 Idiopathic gout, right hand: Secondary | ICD-10-CM

## 2018-11-21 ENCOUNTER — Other Ambulatory Visit: Payer: Self-pay | Admitting: Family Medicine

## 2018-11-21 NOTE — Telephone Encounter (Signed)
Please review

## 2019-02-24 ENCOUNTER — Other Ambulatory Visit: Payer: Self-pay | Admitting: Family Medicine

## 2019-02-25 NOTE — Telephone Encounter (Signed)
Pharmacy requesting refills. Thanks!  

## 2019-03-18 ENCOUNTER — Encounter (INDEPENDENT_AMBULATORY_CARE_PROVIDER_SITE_OTHER): Payer: BLUE CROSS/BLUE SHIELD | Admitting: Ophthalmology

## 2019-03-25 ENCOUNTER — Encounter (INDEPENDENT_AMBULATORY_CARE_PROVIDER_SITE_OTHER): Payer: BC Managed Care – PPO | Admitting: Ophthalmology

## 2019-03-25 ENCOUNTER — Other Ambulatory Visit: Payer: Self-pay

## 2019-03-25 DIAGNOSIS — H43813 Vitreous degeneration, bilateral: Secondary | ICD-10-CM

## 2019-03-25 DIAGNOSIS — H338 Other retinal detachments: Secondary | ICD-10-CM

## 2019-03-25 DIAGNOSIS — H35373 Puckering of macula, bilateral: Secondary | ICD-10-CM

## 2019-03-25 DIAGNOSIS — I10 Essential (primary) hypertension: Secondary | ICD-10-CM | POA: Diagnosis not present

## 2019-03-25 DIAGNOSIS — H35033 Hypertensive retinopathy, bilateral: Secondary | ICD-10-CM

## 2019-05-08 ENCOUNTER — Other Ambulatory Visit: Payer: Self-pay | Admitting: Family Medicine

## 2019-06-18 ENCOUNTER — Telehealth: Payer: Self-pay

## 2019-06-18 NOTE — Telephone Encounter (Signed)
Pt stated he came by the office while we were closed for lunch and left his physical form that needs to be completed for his work in an envelope in the office door. Pt wanted to verify we got the form and request an update on when it will be completed. Please advise. Thanks TNP

## 2019-06-18 NOTE — Telephone Encounter (Signed)
Form is completed, we just need the waist measurement. Left message to call back.

## 2019-06-24 NOTE — Telephone Encounter (Signed)
Pt calling back. Please call back Please call work number

## 2019-06-24 NOTE — Telephone Encounter (Signed)
Wayne Goodwin was finishing them--I think she just needed waist measurement. I do not have them but she did last week.

## 2019-06-24 NOTE — Telephone Encounter (Signed)
Left message advising pt form is complete, but form is missing waist measurement.

## 2019-06-24 NOTE — Telephone Encounter (Signed)
Pt calling back. Pt would like a call back about completing forms

## 2019-06-24 NOTE — Telephone Encounter (Signed)
Please review message from [facility]

## 2019-06-24 NOTE — Telephone Encounter (Signed)
Form faxed

## 2019-08-11 ENCOUNTER — Other Ambulatory Visit: Payer: Self-pay | Admitting: Family Medicine

## 2019-08-12 NOTE — Telephone Encounter (Signed)
Requested medication (s) are due for refill today: yes  Requested medication (s) are on the active medication list: yes  Last refill:  08/11/19  Future visit scheduled: yes  Notes to clinic: not delegated    Requested Prescriptions  Pending Prescriptions Disp Refills   zolpidem (AMBIEN) 5 MG tablet [Pharmacy Med Name: ZOLPIDEM TARTRATE 5 MG TAB] 30 tablet     Sig: TAKE ONE TABLET BY MOUTH AT BEDTIME      Not Delegated - Psychiatry:  Anxiolytics/Hypnotics Failed - 08/11/2019  7:39 PM      Failed - This refill cannot be delegated      Failed - Urine Drug Screen completed in last 360 days.      Failed - Valid encounter within last 6 months    Recent Outpatient Visits           11 months ago Annual physical exam   [facility] Jerrol Banana., MD   1 year ago Obesity (BMI 35.0-39.9 without comorbidity)   [facility] Jerrol Banana., MD   2 years ago Obesity (BMI 35.0-39.9 without comorbidity)   [facility] Jerrol Banana., MD   2 years ago Annual physical exam   [facility] Jerrol Banana., MD   3 years ago Annual physical exam   [facility] Jerrol Banana., MD       Future Appointments             In 1 month Jerrol Banana., MD [facility], [place]

## 2019-09-15 NOTE — Progress Notes (Signed)
Patient: Wayne Goodwin, Male    DOB: April 12, 1951, 69 y.o.   MRN: [id number] Visit Date: 09/16/2019  Today's Provider: Wilhemena Durie, MD   Chief Complaint  Patient presents with  . Annual Exam   Subjective:     Annual physical exam Wayne Goodwin is a 69 y.o. male who presents today for health maintenance and complete physical. He feels well. He reports exercising yes. He reports he is sleeping well.  ----------------------------------------------------------  Colonoscopy: 05/30/2017  Review of Systems  Constitutional: Negative.   HENT: Negative.   Eyes: Negative.   Respiratory: Negative.   Cardiovascular: Negative.   Gastrointestinal: Negative.   Endocrine: Negative.   Genitourinary: Negative.   Musculoskeletal: Positive for arthralgias.  Allergic/Immunologic: Negative.   Neurological: Negative.   Hematological: Negative.   Psychiatric/Behavioral: Negative.     Social History      He  reports that he has never smoked. He has never used smokeless tobacco. He reports current alcohol use. He reports that he does not use drugs.       Social History   Socioeconomic History  . Marital status: Married    Spouse name: Not on file  . Number of children: Not on file  . Years of education: Not on file  . Highest education level: Not on file  Occupational History  . Not on file  Tobacco Use  . Smoking status: Never Smoker  . Smokeless tobacco: Never Used  Substance and Sexual Activity  . Alcohol use: Yes    Alcohol/week: 0.0 standard drinks    Comment: 12 per week  . Drug use: No  . Sexual activity: Not on file  Other Topics Concern  . Not on file  Social History Narrative  . Not on file   Social Determinants of Health   Financial Resource Strain:   . Difficulty of Paying Living Expenses: Not on file  Food Insecurity:   . Worried About Charity fundraiser in the Last Year: Not on file  . Ran Out of Food in the Last Year: Not on file  Transportation  Needs:   . Lack of Transportation (Medical): Not on file  . Lack of Transportation (Non-Medical): Not on file  Physical Activity:   . Days of Exercise per Week: Not on file  . Minutes of Exercise per Session: Not on file  Stress:   . Feeling of Stress : Not on file  Social Connections:   . Frequency of Communication with Friends and Family: Not on file  . Frequency of Social Gatherings with Friends and Family: Not on file  . Attends Religious Services: Not on file  . Active Member of Clubs or Organizations: Not on file  . Attends Archivist Meetings: Not on file  . Marital Status: Not on file    Past Medical History:  Diagnosis Date  . Arthritis   . BPH (benign prostatic hyperplasia)   . Cancer [facility]) 2006   prostate  . Complication of anesthesia    woke up during 2 procedures  . Dyspnea   . Hyperlipidemia   . Hypertension      Patient Active Problem List   Diagnosis Date Noted  . Hypercholesteremia 07/14/2015  . Gout 07/14/2015  . BP (high blood pressure) 07/14/2015  . Insomnia, persistent 07/14/2015  . 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 CA of prostate ([place]) 07/14/2015  . Detached retina 07/14/2015  . 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 Prostate cancer (  [place]) 07/29/2014  . Essential hypertension 07/29/2014  . Hyperlipidemia 07/29/2014  . Obesity 01/30/2014  . Elevated prostate specific antigen (PSA) 11/14/2013  . Benign prostatic hyperplasia with urinary obstruction 11/14/2013    Past Surgical History:  Procedure Laterality Date  . CATARACT EXTRACTION W/ INTRAOCULAR LENS  IMPLANT, BILATERAL    . COLONOSCOPY  2008   Tubular adenoma of the sigmoid colon, hyperplastic polyp 1. [name], M.D.  . COLONOSCOPY W/ BIOPSIES  2013   Benign mucosal biopsy, suspected left colon lipoma. [name], M.D.  . COLONOSCOPY WITH PROPOFOL N/A 05/30/2017   Procedure: COLONOSCOPY WITH PROPOFOL;  Surgeon: Robert Bellow, MD;  Location: [facility] ENDOSCOPY;   Service: Endoscopy;  Laterality: N/A;  . EYE SURGERY    . INSERTION PROSTATE RADIATION SEED  2015  . RETINAL DETACHMENT SURGERY Bilateral   . UMBILICAL HERNIA REPAIR N/A 01/18/2017   Procedure: HERNIA REPAIR UMBILICAL ADULT;  Surgeon: Robert Bellow, MD;  Location: [facility] ORS;  Service: General;  Laterality: N/A;  . VASECTOMY      Family History        Family Status  Relation Name Status  . Mother  Deceased at age 74  . Father  Deceased at age 63  . Brother  Alive        His family history includes Breast cancer in his mother; Cancer in his father; Gout in his father; Hypertension in his father and mother.      No Known Allergies   Current Outpatient Medications:  .  allopurinol (ZYLOPRIM) 300 MG tablet, TAKE 1 TABLET BY MOUTH DAILY, Disp: 30 tablet, Rfl: 11 .  amLODipine-benazepril (LOTREL) 10-20 MG capsule, TAKE 1 CAPSULE EVERY DAY, Disp: 30 capsule, Rfl: 11 .  aspirin EC 81 MG tablet, Take 81 mg by mouth., Disp: , Rfl:  .  Bioflavonoid Products (BIOFLEX PO), Take 2 capsules by mouth daily., Disp: , Rfl:  .  Milk Thistle 250 MG CAPS, Take 1 capsule by mouth daily., Disp: , Rfl:  .  Multiple Vitamin (MULTIVITAMIN) tablet, Take 1 tablet by mouth daily., Disp: , Rfl:  .  NON FORMULARY, CBD Oil, Disp: , Rfl:  .  phentermine (ADIPEX-P) 37.5 MG tablet, TAKE 1 TABLET BY MOUTH EVERY MORNING, Disp: 30 tablet, Rfl: 3 .  phentermine 37.5 MG capsule, Take 1 capsule (37.5 mg total) by mouth every morning., Disp: 30 capsule, Rfl: 3 .  rosuvastatin (CRESTOR) 10 MG tablet, TAKE ONE TABLET EVERY DAY, Disp: 30 tablet, Rfl: 11 .  tamsulosin (FLOMAX) 0.4 MG CAPS capsule, TAKE 1 CAPSULE EVERY DAY, Disp: 30 capsule, Rfl: 11 .  zolpidem (AMBIEN) 5 MG tablet, TAKE ONE TABLET BY MOUTH AT BEDTIME, Disp: 30 tablet, Rfl: 1 .  colchicine 0.6 MG tablet, Take 1 tablet (0.6 mg total) by mouth daily. (Patient not taking: Reported on 11/07/2017), Disp: 30 tablet, Rfl: 5 .  indomethacin (INDOCIN) 50 MG capsule,  TAKE ONE CAPSULE THREE TIMES A DAY AS NEEDED FOR GOUT (Patient not taking: Reported on 11/07/2017), Disp: 90 capsule, Rfl: 11   Patient Care Team: Jerrol Banana., MD as PCP - General (Family Medicine) Jerrol Banana., MD (Family Medicine) Bary Castilla, Forest Gleason, MD (General Surgery)    Objective:    Vitals: BP (!) 156/90 (BP Location: Right Arm, Patient Position: Sitting, Cuff Size: Large)   Pulse 92   Temp (!) 96.2 F (35.7 C) (Other (Comment))   Resp 18   Ht 6' (1.829 m)   Wt 286 lb (  129.7 kg)   SpO2 97%   BMI 38.79 kg/m    Vitals:   09/16/19 0951  BP: (!) 156/90  Pulse: 92  Resp: 18  Temp: (!) 96.2 F (35.7 C)  TempSrc: Other (Comment)  SpO2: 97%  Weight: 286 lb (129.7 kg)  Height: 6' (1.829 m)     Physical Exam Vitals reviewed.  Constitutional:      Appearance: He is well-developed. He is obese.  HENT:     Head: Normocephalic and atraumatic.     Right Ear: External ear normal.     Left Ear: External ear normal.     Nose: Nose normal.     Mouth/Throat:     Pharynx: Oropharynx is clear.  Eyes:     General: No scleral icterus.    Conjunctiva/sclera: Conjunctivae normal.     Pupils: Pupils are equal, round, and reactive to light.  Cardiovascular:     Rate and Rhythm: Normal rate and regular rhythm.     Heart sounds: Normal heart sounds.  Pulmonary:     Effort: Pulmonary effort is normal.     Breath sounds: Normal breath sounds.  Abdominal:     General: Bowel sounds are normal.     Palpations: Abdomen is soft.  Genitourinary:    Penis: Normal.      Testes: Normal.  Musculoskeletal:        General: Normal range of motion.     Cervical back: Normal range of motion and neck supple.  Skin:    General: Skin is warm and dry.  Neurological:     General: No focal deficit present.     Mental Status: He is alert and oriented to person, place, and time. Mental status is at baseline.     Deep Tendon Reflexes: Reflexes are normal and symmetric.    Psychiatric:        Mood and Affect: Mood normal.        Behavior: Behavior normal.        Thought Content: Thought content normal.        Judgment: Judgment normal.      Depression Screen PHQ 2/9 Scores 09/16/2019 09/10/2018 07/10/2017 05/01/2017  PHQ - 2 Score 0 0 0 0  PHQ- 9 Score 0 0 0 -       Assessment & Plan:     Routine Health Maintenance and Physical Exam  Exercise Activities and Dietary recommendations Goals   None     Immunization History  Administered Date(s) Administered  . Hepatitis A 11/22/2011  . Hepatitis A, Adult 07/29/2014  . Hepatitis B 11/22/2011  . Influenza, High Dose Seasonal PF 05/01/2017, 06/10/2019  . Influenza-Unspecified 06/13/2018  . Pneumococcal Conjugate-13 05/01/2017  . Tdap 04/01/2010, 07/29/2014  . Typhoid Inactivated 07/29/2014    Health Maintenance  Topic Date Due  . PNA vac Low Risk Adult (2 of 2 - PPSV23) 05/01/2018  . TETANUS/TDAP  07/29/2024  . COLONOSCOPY  05/31/2027  . INFLUENZA VACCINE  Completed  . Hepatitis C Screening  Completed     Discussed health benefits of physical activity, and encouraged him to engage in regular exercise appropriate for his age and condition.    --------------------------------------------------------------------  1. Annual physical exam  - Lipid panel - PSA - CBC w/Diff/Platelet - Comprehensive Metabolic Panel (CMET) - TSH - Hemoglobin A1C - POCT urinalysis dipstick-Normal  2. Essential hypertension Increase amlodipine-benazepril (Lotrel) from 10-20 mg to 10/40 mg daily.  RTC 3 months. - Lipid panel - PSA - CBC w/Diff/Platelet -  Comprehensive Metabolic Panel (CMET) - TSH - Hemoglobin A1C - amLODipine-benazepril (LOTREL) 10-40 MG capsule; Take 1 capsule by mouth daily.  Dispense: 30 capsule; Refill: 11  3. Hypercholesteremia  - Lipid panel - PSA - CBC w/Diff/Platelet - Comprehensive Metabolic Panel (CMET) - TSH - Hemoglobin A1C  4. Hyperglycemia  - Lipid panel -  PSA - CBC w/Diff/Platelet - Comprehensive Metabolic Panel (CMET) - TSH - Hemoglobin A1C  5. Prostate cancer ([place])  - Lipid panel - PSA - CBC w/Diff/Platelet - Comprehensive Metabolic Panel (CMET) - TSH - Hemoglobin A1C 6.Obesity   Follow up in 3 months.   I,Chariti Havel,acting as a scribe for Wilhemena Durie, MD.,have documented all relevant documentation on the behalf of Wilhemena Durie, MD,as directed by  Wilhemena Durie, MD while in the presence of Wilhemena Durie, MD.    Wilhemena Durie, MD  [facility]

## 2019-09-16 ENCOUNTER — Encounter: Payer: Self-pay | Admitting: Family Medicine

## 2019-09-16 ENCOUNTER — Ambulatory Visit (INDEPENDENT_AMBULATORY_CARE_PROVIDER_SITE_OTHER): Payer: BC Managed Care – PPO | Admitting: Family Medicine

## 2019-09-16 ENCOUNTER — Other Ambulatory Visit: Payer: Self-pay

## 2019-09-16 VITALS — BP 143/88 | HR 94 | Temp 96.2°F | Resp 18 | Ht 72.0 in | Wt 286.0 lb

## 2019-09-16 DIAGNOSIS — Z0000 Encounter for general adult medical examination without abnormal findings: Secondary | ICD-10-CM | POA: Diagnosis not present

## 2019-09-16 DIAGNOSIS — C61 Malignant neoplasm of prostate: Secondary | ICD-10-CM

## 2019-09-16 DIAGNOSIS — I10 Essential (primary) hypertension: Secondary | ICD-10-CM

## 2019-09-16 DIAGNOSIS — E7800 Pure hypercholesterolemia, unspecified: Secondary | ICD-10-CM

## 2019-09-16 DIAGNOSIS — R739 Hyperglycemia, unspecified: Secondary | ICD-10-CM | POA: Diagnosis not present

## 2019-09-16 LAB — POCT URINALYSIS DIPSTICK
Appearance: NORMAL
Bilirubin, UA: NEGATIVE
Blood, UA: NEGATIVE
Glucose, UA: NEGATIVE
Ketones, UA: NEGATIVE
Leukocytes, UA: NEGATIVE
Nitrite, UA: NEGATIVE
Odor: NORMAL
Protein, UA: NEGATIVE
Spec Grav, UA: >=1.03 — AB (ref 1.010–1.025)
Urobilinogen, UA: 0.2 E.U./dL
pH, UA: 5 (ref 5.0–8.0)

## 2019-09-16 MED ORDER — AMLODIPINE BESY-BENAZEPRIL HCL 10-40 MG PO CAPS: 1.0000 | ORAL_CAPSULE | Freq: Every day | ORAL | 11 refills | Status: DC

## 2019-09-16 NOTE — Patient Instructions (Addendum)
Increase amlodipine-benazepril (Lotrel) from 10-20 mg to 10/40 mg daily. Follow up in 3 months.

## 2019-09-17 LAB — PSA: Prostate Specific Ag, Serum: 1.1 ng/mL (ref 0.0–4.0)

## 2019-09-17 LAB — COMPREHENSIVE METABOLIC PANEL
ALT: 28 IU/L (ref 0–44)
AST: 25 IU/L (ref 0–40)
Albumin/Globulin Ratio: 2.2 (ref 1.2–2.2)
Albumin: 4.9 g/dL — ABNORMAL HIGH (ref 3.8–4.8)
Alkaline Phosphatase: 86 IU/L (ref 39–117)
BUN/Creatinine Ratio: 19 (ref 10–24)
BUN: 19 mg/dL (ref 8–27)
Bilirubin Total: 0.5 mg/dL (ref 0.0–1.2)
CO2: 21 mmol/L (ref 20–29)
Calcium: 9.8 mg/dL (ref 8.6–10.2)
Chloride: 104 mmol/L (ref 96–106)
Creatinine, Ser: 1.01 mg/dL (ref 0.76–1.27)
GFR calc Af Amer: 88 mL/min/{1.73_m2} (ref >59)
GFR calc non Af Amer: 76 mL/min/{1.73_m2} (ref >59)
Globulin, Total: 2.2 g/dL (ref 1.5–4.5)
Glucose: 118 mg/dL — ABNORMAL HIGH (ref 65–99)
Potassium: 4.3 mmol/L (ref 3.5–5.2)
Sodium: 141 mmol/L (ref 134–144)
Total Protein: 7.1 g/dL (ref 6.0–8.5)

## 2019-09-17 LAB — LIPID PANEL
Chol/HDL Ratio: 3.7 ratio (ref 0.0–5.0)
Cholesterol, Total: 155 mg/dL (ref 100–199)
HDL: 42 mg/dL (ref >39)
LDL Chol Calc (NIH): 63 mg/dL (ref 0–99)
Triglycerides: 321 mg/dL — ABNORMAL HIGH (ref 0–149)
VLDL Cholesterol Cal: 50 mg/dL — ABNORMAL HIGH (ref 5–40)

## 2019-09-17 LAB — CBC WITH DIFFERENTIAL/PLATELET
Basophils Absolute: 0 10*3/uL (ref 0.0–0.2)
Basos: 0 %
EOS (ABSOLUTE): 0.2 10*3/uL (ref 0.0–0.4)
Eos: 2 %
Hematocrit: 41.6 % (ref 37.5–51.0)
Hemoglobin: 14.3 g/dL (ref 13.0–17.7)
Immature Grans (Abs): 0 10*3/uL (ref 0.0–0.1)
Immature Granulocytes: 0 %
Lymphocytes Absolute: 2 10*3/uL (ref 0.7–3.1)
Lymphs: 29 %
MCH: 32.6 pg (ref 26.6–33.0)
MCHC: 34.4 g/dL (ref 31.5–35.7)
MCV: 95 fL (ref 79–97)
Monocytes Absolute: 0.5 10*3/uL (ref 0.1–0.9)
Monocytes: 7 %
Neutrophils Absolute: 4.3 10*3/uL (ref 1.4–7.0)
Neutrophils: 62 %
Platelets: 159 10*3/uL (ref 150–450)
RBC: 4.39 x10E6/uL (ref 4.14–5.80)
RDW: 13.1 % (ref 11.6–15.4)
WBC: 6.9 10*3/uL (ref 3.4–10.8)

## 2019-09-17 LAB — TSH: TSH: 2.28 u[IU]/mL (ref 0.450–4.500)

## 2019-09-17 LAB — HEMOGLOBIN A1C
Est. average glucose Bld gHb Est-mCnc: 134 mg/dL
Hgb A1c MFr Bld: 6.3 % — ABNORMAL HIGH (ref 4.8–5.6)

## 2019-09-19 ENCOUNTER — Telehealth: Payer: Self-pay | Admitting: *Deleted

## 2019-09-19 NOTE — Telephone Encounter (Signed)
LMOVM for pt to return call. Okay for PEC triage to give pt results. 

## 2019-09-19 NOTE — Telephone Encounter (Signed)
-----   Message from Jerrol Banana., MD sent at 09/19/2019  2:12 PM EST ----- Labs stable.  Borderline diabetic as in the past.  Continue to work on diet and exercise for this.

## 2019-09-23 NOTE — Telephone Encounter (Signed)
Patient advised as below.  

## 2019-10-14 ENCOUNTER — Other Ambulatory Visit: Payer: Self-pay | Admitting: Family Medicine

## 2019-10-14 DIAGNOSIS — M10041 Idiopathic gout, right hand: Secondary | ICD-10-CM

## 2019-10-14 DIAGNOSIS — E7800 Pure hypercholesterolemia, unspecified: Secondary | ICD-10-CM

## 2019-10-14 NOTE — Telephone Encounter (Signed)
Requested Prescriptions  Pending Prescriptions Disp Refills  . allopurinol (ZYLOPRIM) 300 MG tablet [Pharmacy Med Name: ALLOPURINOL 300 MG TAB] 30 tablet 11    Sig: TAKE 1 TABLET BY MOUTH DAILY     Endocrinology:  Gout Agents Failed - 10/14/2019  2:22 PM      Failed - Uric Acid in normal range and within 360 days    Uric Acid  Date Value Ref Range Status  11/07/2017 7.7 3.7 - 8.6 mg/dL Final    Comment:               Therapeutic target for gout patients: <6.0         Passed - Cr in normal range and within 360 days    Creat  Date Value Ref Range Status  05/01/2017 0.89 0.70 - 1.25 mg/dL Final    Comment:    For patients >74 years of age, the reference limit for Creatinine is approximately 13% higher for people identified as African-American. .    Creatinine, Ser  Date Value Ref Range Status  09/16/2019 1.01 0.76 - 1.27 mg/dL Final         Passed - Valid encounter within last 12 months    Recent Outpatient Visits          4 weeks ago Annual physical exam   [facility] Jerrol Banana., MD   1 year ago Annual physical exam   [facility] Jerrol Banana., MD   1 year ago Obesity (BMI 35.0-39.9 without comorbidity)   [facility] Jerrol Banana., MD   2 years ago Obesity (BMI 35.0-39.9 without comorbidity)   [facility] Jerrol Banana., MD   2 years ago Annual physical exam   [facility] Jerrol Banana., MD      Future Appointments            In 2 months Jerrol Banana., MD [facility], [place]

## 2019-10-14 NOTE — Telephone Encounter (Signed)
Requested Prescriptions  Pending Prescriptions Disp Refills  . rosuvastatin (CRESTOR) 10 MG tablet [Pharmacy Med Name: ROSUVASTATIN CALCIUM 10 MG TAB] 30 tablet 11    Sig: TAKE ONE TABLET EVERY DAY     Cardiovascular:  Antilipid - Statins Failed - 10/14/2019  2:24 PM      Failed - LDL in normal range and within 360 days    LDL Cholesterol (Calc)  Date Value Ref Range Status  05/01/2017 63 mg/dL (calc) Final    Comment:    Reference range: <100 . Desirable range <100 mg/dL for primary prevention;   <70 mg/dL for patients with CHD or diabetic patients  with > or = 2 CHD risk factors. Marland Kitchen LDL-C is now calculated using the Martin-Hopkins  calculation, which is a validated novel method providing  better accuracy than the Friedewald equation in the  estimation of LDL-C.  Cresenciano Genre et al. Annamaria Helling. [id number]): 2061-2068  (http://education.[employer].com/faq/FAQ164)    LDL Chol Calc (NIH)  Date Value Ref Range Status  09/16/2019 63 0 - 99 mg/dL Final         Failed - Triglycerides in normal range and within 360 days    Triglycerides  Date Value Ref Range Status  09/16/2019 321 (H) 0 - 149 mg/dL Final         Passed - Total Cholesterol in normal range and within 360 days    Cholesterol, Total  Date Value Ref Range Status  09/16/2019 155 100 - 199 mg/dL Final         Passed - HDL in normal range and within 360 days    HDL  Date Value Ref Range Status  09/16/2019 42 >39 mg/dL Final         Passed - Patient is not pregnant      Passed - Valid encounter within last 12 months    Recent Outpatient Visits          4 weeks ago Annual physical exam   [facility] Jerrol Banana., MD   1 year ago Annual physical exam   [facility] Jerrol Banana., MD   1 year ago Obesity (BMI 35.0-39.9 without comorbidity)   [facility] Jerrol Banana., MD   2 years ago Obesity (BMI 35.0-39.9 without comorbidity)   [facility] Jerrol Banana., MD   2 years ago Annual physical exam   [facility] Jerrol Banana., MD      Future Appointments            In 2 months Jerrol Banana., MD [facility], [place]

## 2019-10-14 NOTE — Telephone Encounter (Signed)
Requested Prescriptions  Pending Prescriptions Disp Refills  . tamsulosin (FLOMAX) 0.4 MG CAPS capsule [Pharmacy Med Name: TAMSULOSIN HCL 0.4 MG CAP] 30 capsule 11    Sig: TAKE 1 CAPSULE EVERY DAY     Urology: Alpha-Adrenergic Blocker Failed - 10/14/2019  2:22 PM      Failed - Last BP in normal range    BP Readings from Last 1 Encounters:  09/16/19 (!) 143/88         Passed - Valid encounter within last 12 months    Recent Outpatient Visits          4 weeks ago Annual physical exam   [facility] Jerrol Banana., MD   1 year ago Annual physical exam   [facility] Jerrol Banana., MD   1 year ago Obesity (BMI 35.0-39.9 without comorbidity)   [facility] Jerrol Banana., MD   2 years ago Obesity (BMI 35.0-39.9 without comorbidity)   [facility] Jerrol Banana., MD   2 years ago Annual physical exam   [facility] Jerrol Banana., MD      Future Appointments            In 2 months Jerrol Banana., MD [facility], [place]

## 2019-11-05 DIAGNOSIS — H00012 Hordeolum externum right lower eyelid: Secondary | ICD-10-CM | POA: Diagnosis not present

## 2019-11-06 ENCOUNTER — Other Ambulatory Visit: Payer: Self-pay | Admitting: Family Medicine

## 2019-11-06 NOTE — Telephone Encounter (Signed)
Requested medication (s) are due for refill today: yes  Requested medication (s) are on the active medication list: yes  Last refill:  10/08/2019  Future visit scheduled: yes  Notes to clinic:  this refill cannot be delegated    Requested Prescriptions  Pending Prescriptions Disp Refills   zolpidem (AMBIEN) 5 MG tablet [Pharmacy Med Name: ZOLPIDEM TARTRATE 5 MG TAB] 30 tablet     Sig: TAKE ONE TABLET BY MOUTH AT BEDTIME      Not Delegated - Psychiatry:  Anxiolytics/Hypnotics Failed - 11/06/2019  9:45 AM      Failed - This refill cannot be delegated      Failed - Urine Drug Screen completed in last 360 days.      Passed - Valid encounter within last 6 months    Recent Outpatient Visits           1 month ago Annual physical exam   [facility] Jerrol Banana., MD   1 year ago Annual physical exam   [facility] Jerrol Banana., MD   1 year ago Obesity (BMI 35.0-39.9 without comorbidity)   [facility] Jerrol Banana., MD   2 years ago Obesity (BMI 35.0-39.9 without comorbidity)   [facility] Jerrol Banana., MD   2 years ago Annual physical exam   [facility] Jerrol Banana., MD       Future Appointments             In 1 month Jerrol Banana., MD [facility], [place]

## 2019-12-05 ENCOUNTER — Other Ambulatory Visit: Payer: Self-pay | Admitting: Family Medicine

## 2019-12-05 NOTE — Telephone Encounter (Signed)
Requested medication (s) are due for refill today: yes  Requested medication (s) are on the active medication list: yes  Last refill:  11/06/19  #30- 0 refills  Future visit scheduled:yes  Notes to clinic:  Medication not delegated    Requested Prescriptions  Pending Prescriptions Disp Refills   zolpidem (AMBIEN) 5 MG tablet [Pharmacy Med Name: ZOLPIDEM TARTRATE 5 MG TAB] 30 tablet     Sig: TAKE ONE TABLET BY MOUTH AT BEDTIME      Not Delegated - Psychiatry:  Anxiolytics/Hypnotics Failed - 12/05/2019 11:44 AM      Failed - This refill cannot be delegated      Failed - Urine Drug Screen completed in last 360 days.      Passed - Valid encounter within last 6 months    Recent Outpatient Visits           2 months ago Annual physical exam   [facility] Jerrol Banana., MD   1 year ago Annual physical exam   [facility] Jerrol Banana., MD   2 years ago Obesity (BMI 35.0-39.9 without comorbidity)   [facility] Jerrol Banana., MD   2 years ago Obesity (BMI 35.0-39.9 without comorbidity)   [facility] Jerrol Banana., MD   2 years ago Annual physical exam   [facility] Jerrol Banana., MD       Future Appointments             In 1 week Jerrol Banana., MD [facility], [place]

## 2019-12-11 NOTE — Progress Notes (Deleted)
     Established patient visit   Patient: Wayne Goodwin   DOB: [birth year]   69 y.o. Male  MRN: [id number] Visit Date: 12/16/2019  Today's healthcare provider: Wilhemena Durie, MD   No chief complaint on file.  Subjective    HPI  Hypertension, follow-up  BP Readings from Last 3 Encounters:  09/16/19 (!) 143/88  09/10/18 120/78  11/07/17 118/78   Wt Readings from Last 3 Encounters:  09/16/19 286 lb (129.7 kg)  09/10/18 284 lb 12.8 oz (129.2 kg)  11/07/17 276 lb (125.2 kg)     He was last seen for hypertension 3 months ago.  BP at that visit was 143/88. Management since that visit includes; Increased amlodipine-benazepril (Lotrel) from 10-20 mg to 10/40 mg daily. He reports {excellent/good/fair/poor:19665} compliance with treatment. He {is/is not:9024} having side effects. {document side effects if present:1} He {is/is not:9024} exercising. He {is/is not:9024} adherent to low salt diet.   Outside blood pressures are {enter patient reported home BP, or 'not being checked':1}.  He {does/does not:200015} smoke.  Use of agents associated with hypertension: {bp agents assoc with hypertension:511::"none"}.   ---------------------------------------------------------------------------------------------------  Hyperglycemia From 09/16/2019-Hemoglobin A1C 6.3. Labs checked showing-stable. Borderline diabetic as in the past. Advised to continue to work on diet and exercise for this. {Show patient history (optional):23778::" "}   Medications: Outpatient Medications Prior to Visit  Medication Sig  . allopurinol (ZYLOPRIM) 300 MG tablet TAKE 1 TABLET BY MOUTH DAILY  . amLODipine-benazepril (LOTREL) 10-40 MG capsule Take 1 capsule by mouth daily.  Marland Kitchen aspirin EC 81 MG tablet Take 81 mg by mouth.  Marland Kitchen Bioflavonoid Products (BIOFLEX PO) Take 2 capsules by mouth daily.  . colchicine 0.6 MG tablet Take 1 tablet (0.6 mg total) by mouth daily. (Patient not taking: Reported on 11/07/2017)    . indomethacin (INDOCIN) 50 MG capsule TAKE ONE CAPSULE THREE TIMES A DAY AS NEEDED FOR GOUT (Patient not taking: Reported on 11/07/2017)  . Milk Thistle 250 MG CAPS Take 1 capsule by mouth daily.  . Multiple Vitamin (MULTIVITAMIN) tablet Take 1 tablet by mouth daily.  . NON FORMULARY CBD Oil  . phentermine (ADIPEX-P) 37.5 MG tablet TAKE 1 TABLET BY MOUTH EVERY MORNING  . phentermine 37.5 MG capsule Take 1 capsule (37.5 mg total) by mouth every morning.  . rosuvastatin (CRESTOR) 10 MG tablet TAKE ONE TABLET EVERY DAY  . tamsulosin (FLOMAX) 0.4 MG CAPS capsule TAKE 1 CAPSULE EVERY DAY  . zolpidem (AMBIEN) 5 MG tablet TAKE ONE TABLET BY MOUTH AT BEDTIME   No facility-administered medications prior to visit.    Review of Systems  Constitutional: Negative for appetite change, chills and fever.  Respiratory: Negative for chest tightness, shortness of breath and wheezing.   Cardiovascular: Negative for chest pain and palpitations.  Gastrointestinal: Negative for abdominal pain, nausea and vomiting.    {Show previous labs (optional):23779::" "}  Objective    There were no vitals taken for this visit. {Show previous vital signs (optional):23777::" "}  Physical Exam  ***  No results found for any visits on 12/16/19.  Assessment & Plan     ***  No follow-ups on file.      {provider attestation***:1}   Wilhemena Durie, MD  [facility] [phone] (phone) [phone] (fax)  [place]

## 2019-12-16 ENCOUNTER — Ambulatory Visit: Payer: Self-pay | Admitting: Family Medicine

## 2020-01-14 ENCOUNTER — Other Ambulatory Visit: Payer: Self-pay | Admitting: Family Medicine

## 2020-01-14 DIAGNOSIS — N401 Enlarged prostate with lower urinary tract symptoms: Secondary | ICD-10-CM

## 2020-02-10 ENCOUNTER — Other Ambulatory Visit: Payer: Self-pay | Admitting: Family Medicine

## 2020-02-10 DIAGNOSIS — M10041 Idiopathic gout, right hand: Secondary | ICD-10-CM

## 2020-02-10 NOTE — Telephone Encounter (Signed)
Requested medications are due for refill today?  Yes  Requested medications are on active medication list?  Yes  Last Refill:   11/07/2017  # 90 with on refills    Future visit scheduled?  No   Notes to Clinic:  Medication failed RX refill protocol due to no uric acid level in the past 360 days.  Last uric acid level was on 11/07/2017.

## 2020-03-30 ENCOUNTER — Encounter (INDEPENDENT_AMBULATORY_CARE_PROVIDER_SITE_OTHER): Payer: BC Managed Care – PPO | Admitting: Ophthalmology

## 2020-04-05 ENCOUNTER — Other Ambulatory Visit: Payer: Self-pay | Admitting: Family Medicine

## 2020-04-05 NOTE — Telephone Encounter (Signed)
Requested medication (s) are due for refill today: yes  Requested medication (s) are on the active medication list: yes  Last refill:  12/08/19 #30 4 refills   Future visit scheduled: no, greater than 6 months OV  Notes to clinic:   not delegated per protocol     Requested Prescriptions  Pending Prescriptions Disp Refills   zolpidem (AMBIEN) 5 MG tablet [Pharmacy Med Name: ZOLPIDEM TARTRATE 5 MG TAB] 30 tablet     Sig: TAKE ONE TABLET BY MOUTH AT BEDTIME      Not Delegated - Psychiatry:  Anxiolytics/Hypnotics Failed - 04/05/2020  5:16 PM      Failed - This refill cannot be delegated      Failed - Urine Drug Screen completed in last 360 days.      Failed - Valid encounter within last 6 months    Recent Outpatient Visits           6 months ago Annual physical exam   [facility] Jerrol Banana., MD   1 year ago Annual physical exam   [facility] Jerrol Banana., MD   2 years ago Obesity (BMI 35.0-39.9 without comorbidity)   [facility] Jerrol Banana., MD   2 years ago Obesity (BMI 35.0-39.9 without comorbidity)   [facility] Jerrol Banana., MD   2 years ago Annual physical exam   [facility] Jerrol Banana., MD

## 2020-04-20 DIAGNOSIS — R05 Cough: Secondary | ICD-10-CM | POA: Diagnosis not present

## 2020-04-20 DIAGNOSIS — Z20822 Contact with and (suspected) exposure to covid-19: Secondary | ICD-10-CM | POA: Diagnosis not present

## 2020-04-20 DIAGNOSIS — Z03818 Encounter for observation for suspected exposure to other biological agents ruled out: Secondary | ICD-10-CM | POA: Diagnosis not present

## 2020-04-21 ENCOUNTER — Encounter (INDEPENDENT_AMBULATORY_CARE_PROVIDER_SITE_OTHER): Payer: BC Managed Care – PPO | Admitting: Ophthalmology

## 2020-04-29 ENCOUNTER — Encounter (INDEPENDENT_AMBULATORY_CARE_PROVIDER_SITE_OTHER): Payer: BC Managed Care – PPO | Admitting: Ophthalmology

## 2020-05-04 ENCOUNTER — Other Ambulatory Visit: Payer: Self-pay

## 2020-05-04 ENCOUNTER — Encounter (INDEPENDENT_AMBULATORY_CARE_PROVIDER_SITE_OTHER): Payer: BC Managed Care – PPO | Admitting: Ophthalmology

## 2020-05-04 DIAGNOSIS — H35373 Puckering of macula, bilateral: Secondary | ICD-10-CM

## 2020-05-04 DIAGNOSIS — H35033 Hypertensive retinopathy, bilateral: Secondary | ICD-10-CM

## 2020-05-04 DIAGNOSIS — H338 Other retinal detachments: Secondary | ICD-10-CM | POA: Diagnosis not present

## 2020-05-04 DIAGNOSIS — I10 Essential (primary) hypertension: Secondary | ICD-10-CM | POA: Diagnosis not present

## 2020-05-04 DIAGNOSIS — H43813 Vitreous degeneration, bilateral: Secondary | ICD-10-CM

## 2020-05-12 ENCOUNTER — Other Ambulatory Visit: Payer: Self-pay | Admitting: Family Medicine

## 2020-05-12 DIAGNOSIS — M10041 Idiopathic gout, right hand: Secondary | ICD-10-CM

## 2020-05-13 ENCOUNTER — Telehealth: Payer: Self-pay

## 2020-05-13 DIAGNOSIS — I10 Essential (primary) hypertension: Secondary | ICD-10-CM | POA: Diagnosis not present

## 2020-05-13 DIAGNOSIS — C61 Malignant neoplasm of prostate: Secondary | ICD-10-CM

## 2020-05-13 DIAGNOSIS — E7800 Pure hypercholesterolemia, unspecified: Secondary | ICD-10-CM

## 2020-05-13 DIAGNOSIS — R739 Hyperglycemia, unspecified: Secondary | ICD-10-CM

## 2020-05-13 NOTE — Telephone Encounter (Signed)
Pt came in the office stated that he had called earlier this week requesting labs to be ordered and he was told by [facility] that he could just come in to get labs done. Pt is requesting to re-do same labs he had done in Feb so he can prevent his insurance from increasing. Pt is waiting in waiting room. Dr. Rosanna Randy is out of the office rest of week. Please advise. Thanks TNP

## 2020-05-13 NOTE — Telephone Encounter (Signed)
Patient advised.KW 

## 2020-05-13 NOTE — Telephone Encounter (Signed)
Looks like not due for labs until 2/22 but ok with placing orders for previous labs for screening if he will schedule follow up with Dr. Rosanna Randy.

## 2020-05-13 NOTE — Telephone Encounter (Signed)
Okay to put in order for screening labs? KW

## 2020-05-14 LAB — CBC WITH DIFFERENTIAL/PLATELET
Basophils Absolute: 0 10*3/uL (ref 0.0–0.2)
Basos: 0 %
EOS (ABSOLUTE): 0.2 10*3/uL (ref 0.0–0.4)
Eos: 3 %
Hematocrit: 41.5 % (ref 37.5–51.0)
Hemoglobin: 13.9 g/dL (ref 13.0–17.7)
Immature Grans (Abs): 0 10*3/uL (ref 0.0–0.1)
Immature Granulocytes: 0 %
Lymphocytes Absolute: 2 10*3/uL (ref 0.7–3.1)
Lymphs: 28 %
MCH: 32.5 pg (ref 26.6–33.0)
MCHC: 33.5 g/dL (ref 31.5–35.7)
MCV: 97 fL (ref 79–97)
Monocytes Absolute: 0.5 10*3/uL (ref 0.1–0.9)
Monocytes: 7 %
Neutrophils Absolute: 4.4 10*3/uL (ref 1.4–7.0)
Neutrophils: 62 %
Platelets: 153 10*3/uL (ref 150–450)
RBC: 4.28 x10E6/uL (ref 4.14–5.80)
RDW: 13.1 % (ref 11.6–15.4)
WBC: 7.2 10*3/uL (ref 3.4–10.8)

## 2020-05-14 LAB — COMPREHENSIVE METABOLIC PANEL
ALT: 35 IU/L (ref 0–44)
AST: 37 IU/L (ref 0–40)
Albumin/Globulin Ratio: 2.7 — ABNORMAL HIGH (ref 1.2–2.2)
Albumin: 4.9 g/dL — ABNORMAL HIGH (ref 3.8–4.8)
Alkaline Phosphatase: 74 IU/L (ref 44–121)
BUN/Creatinine Ratio: 22 (ref 10–24)
BUN: 21 mg/dL (ref 8–27)
Bilirubin Total: 0.6 mg/dL (ref 0.0–1.2)
CO2: 21 mmol/L (ref 20–29)
Calcium: 9.7 mg/dL (ref 8.6–10.2)
Chloride: 104 mmol/L (ref 96–106)
Creatinine, Ser: 0.95 mg/dL (ref 0.76–1.27)
GFR calc Af Amer: 95 mL/min/{1.73_m2} (ref >59)
GFR calc non Af Amer: 82 mL/min/{1.73_m2} (ref >59)
Globulin, Total: 1.8 g/dL (ref 1.5–4.5)
Glucose: 141 mg/dL — ABNORMAL HIGH (ref 65–99)
Potassium: 4.1 mmol/L (ref 3.5–5.2)
Sodium: 141 mmol/L (ref 134–144)
Total Protein: 6.7 g/dL (ref 6.0–8.5)

## 2020-05-14 LAB — TSH: TSH: 2.27 u[IU]/mL (ref 0.450–4.500)

## 2020-05-14 LAB — LIPID PANEL
Chol/HDL Ratio: 3.6 ratio (ref 0.0–5.0)
Cholesterol, Total: 142 mg/dL (ref 100–199)
HDL: 39 mg/dL — ABNORMAL LOW (ref >39)
LDL Chol Calc (NIH): 58 mg/dL (ref 0–99)
Triglycerides: 286 mg/dL — ABNORMAL HIGH (ref 0–149)
VLDL Cholesterol Cal: 45 mg/dL — ABNORMAL HIGH (ref 5–40)

## 2020-05-14 LAB — PSA: Prostate Specific Ag, Serum: 1.1 ng/mL (ref 0.0–4.0)

## 2020-07-13 ENCOUNTER — Other Ambulatory Visit: Payer: Self-pay | Admitting: Family Medicine

## 2020-07-13 DIAGNOSIS — N138 Other obstructive and reflux uropathy: Secondary | ICD-10-CM

## 2020-07-13 DIAGNOSIS — N401 Enlarged prostate with lower urinary tract symptoms: Secondary | ICD-10-CM

## 2020-08-03 ENCOUNTER — Other Ambulatory Visit: Payer: Self-pay | Admitting: Family Medicine

## 2020-08-03 DIAGNOSIS — E7800 Pure hypercholesterolemia, unspecified: Secondary | ICD-10-CM

## 2020-08-03 NOTE — Telephone Encounter (Signed)
Requested medication (s) are due for refill today: yes  Requested medication (s) are on the active medication list: yes  Last refill: 06/30/2020  Future visit scheduled: no  Notes to clinic:  this refill cannot be delegated    Requested Prescriptions  Pending Prescriptions Disp Refills   zolpidem (AMBIEN) 5 MG tablet [Pharmacy Med Name: ZOLPIDEM TARTRATE 5 MG TAB] 30 tablet     Sig: TAKE ONE TABLET BY MOUTH AT BEDTIME      Not Delegated - Psychiatry:  Anxiolytics/Hypnotics Failed - 08/03/2020  3:23 PM      Failed - This refill cannot be delegated      Failed - Urine Drug Screen completed in last 360 days      Failed - Valid encounter within last 6 months    Recent Outpatient Visits           10 months ago Annual physical exam   [facility] Jerrol Banana., MD   1 year ago Annual physical exam   [facility] Jerrol Banana., MD   2 years ago Obesity (BMI 35.0-39.9 without comorbidity)   [facility] Jerrol Banana., MD   3 years ago Obesity (BMI 35.0-39.9 without comorbidity)   [facility] Jerrol Banana., MD   3 years ago Annual physical exam   [facility] Jerrol Banana., MD                 Signed Prescriptions Disp Refills   rosuvastatin (CRESTOR) 10 MG tablet 30 tablet 5    Sig: TAKE ONE TABLET EVERY DAY      Cardiovascular:  Antilipid - Statins Failed - 08/03/2020  3:23 PM      Failed - LDL in normal range and within 360 days    LDL Cholesterol (Calc)  Date Value Ref Range Status  05/01/2017 63 mg/dL (calc) Final    Comment:    Reference range: <100 . Desirable range <100 mg/dL for primary prevention;   <70 mg/dL for patients with CHD or diabetic patients  with > or = 2 CHD risk factors. Marland Kitchen LDL-C is now calculated using the Martin-Hopkins  calculation, which is a validated novel method providing  better accuracy than the Friedewald equation in  the  estimation of LDL-C.  Cresenciano Genre et al. Annamaria Helling. [id number]): 2061-2068  (http://education.[employer].com/faq/FAQ164)    LDL Chol Calc (NIH)  Date Value Ref Range Status  05/13/2020 58 0 - 99 mg/dL Final          Failed - HDL in normal range and within 360 days    HDL  Date Value Ref Range Status  05/13/2020 39 (L) >39 mg/dL Final          Failed - Triglycerides in normal range and within 360 days    Triglycerides  Date Value Ref Range Status  05/13/2020 286 (H) 0 - 149 mg/dL Final          Passed - Total Cholesterol in normal range and within 360 days    Cholesterol, Total  Date Value Ref Range Status  05/13/2020 142 100 - 199 mg/dL Final          Passed - Patient is not pregnant      Passed - Valid encounter within last 12 months    Recent Outpatient Visits           10 months ago Annual physical exam   [place]  Leonette Monarch., MD   1 year ago Annual physical exam   [facility] Maple Hudson., MD   2 years ago Obesity (BMI 35.0-39.9 without comorbidity)   [facility] Maple Hudson., MD   3 years ago Obesity (BMI 35.0-39.9 without comorbidity)   [facility] Maple Hudson., MD   3 years ago Annual physical exam   [facility] Maple Hudson., MD

## 2020-08-06 ENCOUNTER — Other Ambulatory Visit: Payer: Self-pay | Admitting: Family Medicine

## 2020-08-06 DIAGNOSIS — I10 Essential (primary) hypertension: Secondary | ICD-10-CM

## 2020-09-28 ENCOUNTER — Other Ambulatory Visit: Payer: Self-pay | Admitting: Family Medicine

## 2020-09-28 DIAGNOSIS — I10 Essential (primary) hypertension: Secondary | ICD-10-CM

## 2020-09-28 NOTE — Telephone Encounter (Signed)
Unable to send to office due to smart phrase will not allow.  Attempted to contact patient to schedule appt. No answer, left voicemail to call back and schedule appt.  Last refill:  08/09/20 #30 0 refills  No future visit scheduled  Do you want courtesy refill?

## 2020-10-15 ENCOUNTER — Other Ambulatory Visit: Payer: Self-pay | Admitting: Family Medicine

## 2020-10-15 DIAGNOSIS — N138 Other obstructive and reflux uropathy: Secondary | ICD-10-CM

## 2020-10-15 NOTE — Telephone Encounter (Signed)
  Notes to clinic: overdue for appt Patient has appt in 01/2021 Review for refill until that time   Requested Prescriptions  Pending Prescriptions Disp Refills   tamsulosin (FLOMAX) 0.4 MG CAPS capsule [Pharmacy Med Name: TAMSULOSIN HCL 0.4 MG CAP] 90 capsule 0    Sig: TAKE 1 CAPSULE BY MOUTH ONCE DAILY      Urology: Alpha-Adrenergic Blocker Failed - 10/15/2020 10:20 AM      Failed - Last BP in normal range    BP Readings from Last 1 Encounters:  09/16/19 (!) 143/88          Failed - Valid encounter within last 12 months    Recent Outpatient Visits           1 year ago Annual physical exam   [facility] Jerrol Banana., MD   2 years ago Annual physical exam   [facility] Jerrol Banana., MD   2 years ago Obesity (BMI 35.0-39.9 without comorbidity)   [facility] Jerrol Banana., MD   3 years ago Obesity (BMI 35.0-39.9 without comorbidity)   [facility] Jerrol Banana., MD   3 years ago Annual physical exam   [facility] Jerrol Banana., MD       Future Appointments             In 2 months Jerrol Banana., MD [facility] [place]

## 2020-11-03 ENCOUNTER — Other Ambulatory Visit: Payer: Self-pay | Admitting: Family Medicine

## 2020-11-03 DIAGNOSIS — I10 Essential (primary) hypertension: Secondary | ICD-10-CM

## 2020-11-03 NOTE — Telephone Encounter (Signed)
Pt. Has appointment for follow up.

## 2020-11-30 ENCOUNTER — Other Ambulatory Visit: Payer: Self-pay

## 2020-11-30 DIAGNOSIS — G4700 Insomnia, unspecified: Secondary | ICD-10-CM

## 2020-11-30 MED ORDER — ZOLPIDEM TARTRATE 5 MG PO TABS: 5.0000 mg | ORAL_TABLET | Freq: Every day | ORAL | 1 refills | Status: DC

## 2020-11-30 NOTE — Telephone Encounter (Signed)
Last office visit 09/16/19, please review request. Wayne Goodwin

## 2020-11-30 NOTE — Telephone Encounter (Signed)
[facility] faxed refill request for the following medications:  zolpidem (AMBIEN) 5 MG tablet  Note from pharmacy:  Pt had 1 refill left at another location but they were not able to transfer it back.  Please advise.

## 2020-12-13 ENCOUNTER — Other Ambulatory Visit: Payer: Self-pay | Admitting: Family Medicine

## 2020-12-13 DIAGNOSIS — I10 Essential (primary) hypertension: Secondary | ICD-10-CM

## 2021-01-04 ENCOUNTER — Other Ambulatory Visit: Payer: Self-pay | Admitting: Family Medicine

## 2021-01-04 DIAGNOSIS — M10041 Idiopathic gout, right hand: Secondary | ICD-10-CM

## 2021-01-04 DIAGNOSIS — G4700 Insomnia, unspecified: Secondary | ICD-10-CM

## 2021-01-04 NOTE — Telephone Encounter (Signed)
Requested medication (s) are due for refill today: Allopurinol: yes   Ambien: no  Requested medication (s) are on the active medication list: yes  Last refill:  Alloupurinol: 05/12/20    Ambien: 11/30/20 #30 1 RF  Future visit scheduled: yes  Notes to clinic:  Allopurinol: overdue office visit NT not delegated to Refuse this med   Requested Prescriptions  Pending Prescriptions Disp Refills   allopurinol (ZYLOPRIM) 300 MG tablet [Pharmacy Med Name: ALLOPURINOL 300 MG TAB] 90 tablet 1    Sig: TAKE 1 TABLET BY MOUTH DAILY      Endocrinology:  Gout Agents Failed - 01/04/2021  3:56 PM      Failed - Uric Acid in normal range and within 360 days    Uric Acid  Date Value Ref Range Status  11/07/2017 7.7 3.7 - 8.6 mg/dL Final    Comment:               Therapeutic target for gout patients: <6.0          Failed - Valid encounter within last 12 months    Recent Outpatient Visits           1 year ago Annual physical exam   [facility] Jerrol Banana., MD   2 years ago Annual physical exam   [facility] Jerrol Banana., MD   3 years ago Obesity (BMI 35.0-39.9 without comorbidity)   [facility] Jerrol Banana., MD   3 years ago Obesity (BMI 35.0-39.9 without comorbidity)   [facility] Jerrol Banana., MD   3 years ago Annual physical exam   [facility] Jerrol Banana., MD       Future Appointments             In 1 week Jerrol Banana., MD [facility]             Passed - Cr in normal range and within 360 days    Creat  Date Value Ref Range Status  05/01/2017 0.89 0.70 - 1.25 mg/dL Final    Comment:    For patients >48 years of age, the reference limit for Creatinine is approximately 13% higher for people identified as African-American. .    Creatinine, Ser  Date Value Ref Range Status  05/13/2020 0.95 0.76 - 1.27 mg/dL Final             zolpidem (AMBIEN) 5 MG tablet [Pharmacy Med Name: ZOLPIDEM TARTRATE 5 MG TAB] 30 tablet     Sig: TAKE 1 TABLET BY MOUTH NIGHTLY      Not Delegated - Psychiatry:  Anxiolytics/Hypnotics Failed - 01/04/2021  3:56 PM      Failed - This refill cannot be delegated      Failed - Urine Drug Screen completed in last 360 days      Failed - Valid encounter within last 6 months    Recent Outpatient Visits           1 year ago Annual physical exam   [facility] Jerrol Banana., MD   2 years ago Annual physical exam   [facility] Jerrol Banana., MD   3 years ago Obesity (BMI 35.0-39.9 without comorbidity)   [facility] Jerrol Banana., MD   3 years ago Obesity (BMI 35.0-39.9 without comorbidity)   [facility] Jerrol Banana., MD  3 years ago Annual physical exam   [facility] Jerrol Banana., MD       Future Appointments             In 1 week Jerrol Banana., MD [facility] [place]

## 2021-01-11 NOTE — Progress Notes (Signed)
Complete physical exam   Patient: Wayne Goodwin   DOB: 06-Apr-1951   70 y.o. Male  MRN: [id number] Visit Date: 01/12/2021  Today's healthcare provider: Wilhemena Durie, MD   Chief Complaint  Patient presents with   Annual Exam   Subjective    Wayne Goodwin is a 70 y.o. male who presents today for a complete physical exam.  He reports consuming a general diet. The patient does not participate in regular exercise at present. He generally feels fairly well. He reports sleeping fairly well. He does not have additional problems to discuss today.  Patient swims 5 days a week but gets no aerobic exercise to speak of. He is ready to try to lose weight. Is married and is a father of 3 daughters and he has multiple grandchildren. He has had no flares of his gout recently.  Past Medical History:  Diagnosis Date   Arthritis    BPH (benign prostatic hyperplasia)    Cancer ([place]) 2006   prostate   Complication of anesthesia    woke up during 2 procedures   Dyspnea    Hyperlipidemia    Hypertension    Past Surgical History:  Procedure Laterality Date   CATARACT EXTRACTION W/ INTRAOCULAR LENS  IMPLANT, BILATERAL     COLONOSCOPY  2008   Tubular adenoma of the sigmoid colon, hyperplastic polyp 1. [name], M.D.   COLONOSCOPY W/ BIOPSIES  2013   Benign mucosal biopsy, suspected left colon lipoma. [name], M.D.   COLONOSCOPY WITH PROPOFOL N/A 05/30/2017   Procedure: COLONOSCOPY WITH PROPOFOL;  Surgeon: Robert Bellow, MD;  [facility] END[facility]COPY;  Service: Endoscopy;  Laterality: N/A;   EYE SURGERY     INSERTION PROSTATE RADIATION SEED  2015   RETINAL DETACHMENT SURGERY Bilateral    UMBILICAL HERNIA REPAIR N/A 01/18/2017   Procedure: HERNIA REPAIR UMBILICAL ADULT;  Surgeon: Robert Bellow, MD;  Location: [facility] ORS;  Service: General;  Laterality: N/A;   VASECTOMY     Social History   Socioeconomic History   Marital status: Married    Spouse name: Not on  file   Number of children: Not on file   Years of education: Not on file   Highest education level: Not on file  Occupational History   Not on file  Tobacco Use   Smoking status: Never Smoker   Smokeless tobacco: Never Used  Vaping Use   Vaping Use: Never used  Substance and Sexual Activity   Alcohol use: Yes    Alcohol/week: 0.0 standard drinks    Comment: 12 per week   Drug use: No   Sexual activity: Not on file  Other Topics Concern   Not on file  Social History Narrative   Not on file   Social Determinants of Health   Financial Resource Strain: Not on file  Food Insecurity: Not on file  Transportation Needs: Not on file  Physical Activity: Not on file  Stress: Not on file  Social Connections: Not on file  Intimate Partner Violence: Not on file   Family Status  Relation Name Status   Mother  Deceased at age 5   Father  Deceased at age 67   Brother  Alive   Family History  Problem Relation Age of Onset   Hypertension Mother    Breast cancer Mother    Hypertension Father    Cancer Father    Gout Father    No Known Allergies  Patient  Care Team: Jerrol Banana., MD as PCP - General (Family Medicine) Jerrol Banana., MD (Family Medicine) Bary Castilla Forest Gleason, MD (General Surgery)   Medications:    Review of Systems  All other systems reviewed and are negative.      Objective    BP 139/86   Pulse 90   Temp 98.8 F (37.1 C)   Resp 16   Ht 6' (1.829 m)   Wt 298 lb (135.2 kg)   BMI 40.42 kg/m     Physical Exam Vitals reviewed.  Constitutional:      Appearance: He is well-developed. He is obese.  HENT:     Head: Normocephalic and atraumatic.     Right Ear: External ear normal.     Left Ear: External ear normal.     Nose: Nose normal.     Mouth/Throat:     Pharynx: Oropharynx is clear.  Eyes:     General: No scleral icterus.    Conjunctiva/sclera: Conjunctivae normal.     Pupils: Pupils are equal, round, and reactive to  light.  Cardiovascular:     Rate and Rhythm: Normal rate and regular rhythm.     Heart sounds: Normal heart sounds.  Pulmonary:     Effort: Pulmonary effort is normal.     Breath sounds: Normal breath sounds.  Abdominal:     General: Bowel sounds are normal.     Palpations: Abdomen is soft.  Genitourinary:    Penis: Normal.      Testes: Normal.  Musculoskeletal:     Cervical back: Normal range of motion and neck supple.  Skin:    General: Skin is warm and dry.  Neurological:     General: No focal deficit present.     Mental Status: He is alert and oriented to person, place, and time. Mental status is at baseline.     Deep Tendon Reflexes: Reflexes are normal and symmetric.  Psychiatric:        Mood and Affect: Mood normal.        Behavior: Behavior normal.        Thought Content: Thought content normal.        Judgment: Judgment normal.      Last depression screening scores PHQ 2/9 Scores 01/12/2021 09/16/2019 09/10/2018  PHQ - 2 Score 0 0 0  PHQ- 9 Score - 0 0   Last fall risk screening Fall Risk  01/12/2021  Falls in the past year? 0  Comment -  Number falls in past yr: 0  Injury with Fall? 0  Risk for fall due to : No Fall Risks  Follow up Falls evaluation completed   Last Audit-C alcohol use screening Alcohol Use Disorder Test (AUDIT) 01/12/2021  1. How often do you have a drink containing alcohol? 4  2. How many drinks containing alcohol do you have on a typical day when you are drinking? 1  3. How often do you have six or more drinks on one occasion? 2  AUDIT-C Score 7  4. How often during the last year have you found that you were not able to stop drinking once you had started? 0  5. How often during the last year have you failed to do what was normally expected from you because of drinking? 0  6. How often during the last year have you needed a first drink in the morning to get yourself going after a heavy drinking session? 0  7. How often during the  last year have  you had a feeling of guilt of remorse after drinking? 0  8. How often during the last year have you been unable to remember what happened the night before because you had been drinking? 0  9. Have you or someone else been injured as a result of your drinking? 0  10. Has a relative or friend or a doctor or another health worker been concerned about your drinking or suggested you cut down? 0  Alcohol Use Disorder Identification Test Final Score (AUDIT) 7  Alcohol Brief Interventions/Follow-up Alcohol education/Brief advice   A score of 3 or more in women, and 4 or more in men indicates increased risk for alcohol abuse, EXCEPT if all of the points are from question 1   No results found for any visits on 01/12/21.  Assessment & Plan    Routine Health Maintenance and Physical Exam  Exercise Activities and Dietary recommendations Goals   None     Immunization History  Administered Date(s) Administered   Hepatitis A 11/22/2011   Hepatitis A, Adult 07/29/2014   Hepatitis B 11/22/2011   Influenza, High Dose Seasonal PF 05/01/2017, 06/10/2019   Influenza-Unspecified 06/13/2018   Pneumococcal Conjugate-13 05/01/2017   Tdap 04/01/2010, 07/29/2014   Typhoid Inactivated 07/29/2014    Health Maintenance  Topic Date Due   Pneumococcal Vaccine 63-19 Years old (1 of 4 - PCV13) Never done   COVID-19 Vaccine (1) Never done   Zoster Vaccines- Shingrix (1 of 2) Never done   PNA vac Low Risk Adult (2 of 2 - PPSV23) 05/01/2018   INFLUENZA VACCINE  03/07/2021   TETANUS/TDAP  07/29/2024   COLONOSCOPY (Pts 45-32yrs Insurance coverage will need to be confirmed)  05/31/2027   Hepatitis C Screening  Completed   HPV VACCINES  Aged Out    Discussed health benefits of physical activity, and encouraged him to engage in regular exercise appropriate for his age and condition. 1. Annual physical exam Up-to-date health maintenance - CBC with Differential/Platelet - Comprehensive metabolic panel - Lipid  panel - TSH - PSA - POCT urinalysis dipstick  2. Hypercholesteremia Follow-up lipids  3. Acute idiopathic gout of right hand Recently quiescent - Uric acid  4. Need for pneumococcal vaccination  - Pneumococcal polysaccharide vaccine 23-valent greater than or equal to 2yo subcutaneous/IM  5. Need for shingles vaccine  - Varicella-zoster vaccine IM (Shingrix)  6. Class 2 severe obesity due to excess calories with serious comorbidity and body mass index (BMI) of 38.0 to 38.9 in adult [facility]) Diet and exercise along with phentermine.  Follow-up 4 months. - phentermine (ADIPEX-P) 37.5 MG tablet; Take 1 tablet (37.5 mg total) by mouth every morning.  Dispense: 30 tablet; Refill: 3   No follow-ups on file.     I, Wilhemena Durie, MD, have reviewed all documentation for this visit. The documentation on 01/16/21 for the exam, diagnosis, procedures, and orders are all accurate and complete.    Kasee Hantz Cranford Mon, MD  [facility] [phone] (phone) [phone] (fax)  [place]

## 2021-01-12 ENCOUNTER — Other Ambulatory Visit: Payer: Self-pay

## 2021-01-12 ENCOUNTER — Ambulatory Visit (INDEPENDENT_AMBULATORY_CARE_PROVIDER_SITE_OTHER): Payer: BC Managed Care – PPO | Admitting: Family Medicine

## 2021-01-12 ENCOUNTER — Encounter: Payer: Self-pay | Admitting: Family Medicine

## 2021-01-12 ENCOUNTER — Other Ambulatory Visit: Payer: Self-pay | Admitting: Family Medicine

## 2021-01-12 VITALS — BP 139/86 | HR 90 | Temp 98.8°F | Resp 16 | Ht 72.0 in | Wt 298.0 lb

## 2021-01-12 DIAGNOSIS — Z0000 Encounter for general adult medical examination without abnormal findings: Secondary | ICD-10-CM

## 2021-01-12 DIAGNOSIS — Z6838 Body mass index (BMI) 38.0-38.9, adult: Secondary | ICD-10-CM

## 2021-01-12 DIAGNOSIS — E7800 Pure hypercholesterolemia, unspecified: Secondary | ICD-10-CM | POA: Diagnosis not present

## 2021-01-12 DIAGNOSIS — Z23 Encounter for immunization: Secondary | ICD-10-CM

## 2021-01-12 DIAGNOSIS — M10041 Idiopathic gout, right hand: Secondary | ICD-10-CM

## 2021-01-12 DIAGNOSIS — C61 Malignant neoplasm of prostate: Secondary | ICD-10-CM | POA: Diagnosis not present

## 2021-01-12 DIAGNOSIS — I10 Essential (primary) hypertension: Secondary | ICD-10-CM | POA: Diagnosis not present

## 2021-01-12 DIAGNOSIS — Z1329 Encounter for screening for other suspected endocrine disorder: Secondary | ICD-10-CM | POA: Diagnosis not present

## 2021-01-12 LAB — POCT URINALYSIS DIPSTICK
Bilirubin, UA: NEGATIVE
Blood, UA: NEGATIVE
Glucose, UA: NEGATIVE
Ketones, UA: NEGATIVE
Leukocytes, UA: NEGATIVE
Nitrite, UA: NEGATIVE
Protein, UA: NEGATIVE
Spec Grav, UA: 1.01 (ref 1.010–1.025)
Urobilinogen, UA: 0.2 E.U./dL
pH, UA: 6 (ref 5.0–8.0)

## 2021-01-13 ENCOUNTER — Encounter: Payer: Self-pay | Admitting: Dermatology

## 2021-01-13 ENCOUNTER — Ambulatory Visit (INDEPENDENT_AMBULATORY_CARE_PROVIDER_SITE_OTHER): Payer: BC Managed Care – PPO | Admitting: Dermatology

## 2021-01-13 ENCOUNTER — Other Ambulatory Visit: Payer: Self-pay

## 2021-01-13 DIAGNOSIS — D1800 Hemangioma unspecified site: Secondary | ICD-10-CM

## 2021-01-13 DIAGNOSIS — C44319 Basal cell carcinoma of skin of other parts of face: Secondary | ICD-10-CM

## 2021-01-13 DIAGNOSIS — I872 Venous insufficiency (chronic) (peripheral): Secondary | ICD-10-CM | POA: Diagnosis not present

## 2021-01-13 DIAGNOSIS — L578 Other skin changes due to chronic exposure to nonionizing radiation: Secondary | ICD-10-CM

## 2021-01-13 DIAGNOSIS — D492 Neoplasm of unspecified behavior of bone, soft tissue, and skin: Secondary | ICD-10-CM

## 2021-01-13 DIAGNOSIS — C4491 Basal cell carcinoma of skin, unspecified: Secondary | ICD-10-CM

## 2021-01-13 DIAGNOSIS — L281 Prurigo nodularis: Secondary | ICD-10-CM

## 2021-01-13 DIAGNOSIS — Z86018 Personal history of other benign neoplasm: Secondary | ICD-10-CM | POA: Diagnosis not present

## 2021-01-13 DIAGNOSIS — C44519 Basal cell carcinoma of skin of other part of trunk: Secondary | ICD-10-CM

## 2021-01-13 DIAGNOSIS — D229 Melanocytic nevi, unspecified: Secondary | ICD-10-CM

## 2021-01-13 DIAGNOSIS — L821 Other seborrheic keratosis: Secondary | ICD-10-CM

## 2021-01-13 DIAGNOSIS — L918 Other hypertrophic disorders of the skin: Secondary | ICD-10-CM

## 2021-01-13 DIAGNOSIS — L814 Other melanin hyperpigmentation: Secondary | ICD-10-CM

## 2021-01-13 DIAGNOSIS — Z1283 Encounter for screening for malignant neoplasm of skin: Secondary | ICD-10-CM | POA: Diagnosis not present

## 2021-01-13 DIAGNOSIS — L570 Actinic keratosis: Secondary | ICD-10-CM | POA: Diagnosis not present

## 2021-01-13 DIAGNOSIS — L603 Nail dystrophy: Secondary | ICD-10-CM

## 2021-01-13 DIAGNOSIS — C44310 Basal cell carcinoma of skin of unspecified parts of face: Secondary | ICD-10-CM

## 2021-01-13 HISTORY — DX: Basal cell carcinoma of skin, unspecified: C44.91

## 2021-01-13 LAB — TSH: TSH: 2 u[IU]/mL (ref 0.450–4.500)

## 2021-01-13 LAB — COMPREHENSIVE METABOLIC PANEL
ALT: 47 IU/L — ABNORMAL HIGH (ref 0–44)
AST: 52 IU/L — ABNORMAL HIGH (ref 0–40)
Albumin/Globulin Ratio: 2.2 (ref 1.2–2.2)
Albumin: 5 g/dL — ABNORMAL HIGH (ref 3.8–4.8)
Alkaline Phosphatase: 80 IU/L (ref 44–121)
BUN/Creatinine Ratio: 20 (ref 10–24)
BUN: 20 mg/dL (ref 8–27)
Bilirubin Total: 0.7 mg/dL (ref 0.0–1.2)
CO2: 22 mmol/L (ref 20–29)
Calcium: 9.9 mg/dL (ref 8.6–10.2)
Chloride: 101 mmol/L (ref 96–106)
Creatinine, Ser: 1.01 mg/dL (ref 0.76–1.27)
Globulin, Total: 2.3 g/dL (ref 1.5–4.5)
Glucose: 135 mg/dL — ABNORMAL HIGH (ref 65–99)
Potassium: 4.3 mmol/L (ref 3.5–5.2)
Sodium: 141 mmol/L (ref 134–144)
Total Protein: 7.3 g/dL (ref 6.0–8.5)
eGFR: 81 mL/min/{1.73_m2} (ref >59)

## 2021-01-13 LAB — LIPID PANEL
Chol/HDL Ratio: 4.4 ratio (ref 0.0–5.0)
Cholesterol, Total: 172 mg/dL (ref 100–199)
HDL: 39 mg/dL — ABNORMAL LOW (ref >39)
LDL Chol Calc (NIH): 74 mg/dL (ref 0–99)
Triglycerides: 374 mg/dL — ABNORMAL HIGH (ref 0–149)
VLDL Cholesterol Cal: 59 mg/dL — ABNORMAL HIGH (ref 5–40)

## 2021-01-13 LAB — CBC WITH DIFFERENTIAL/PLATELET
Basophils Absolute: 0 10*3/uL (ref 0.0–0.2)
Basos: 1 %
EOS (ABSOLUTE): 0.2 10*3/uL (ref 0.0–0.4)
Eos: 3 %
Hematocrit: 41.5 % (ref 37.5–51.0)
Hemoglobin: 14.3 g/dL (ref 13.0–17.7)
Immature Grans (Abs): 0 10*3/uL (ref 0.0–0.1)
Immature Granulocytes: 0 %
Lymphocytes Absolute: 1.8 10*3/uL (ref 0.7–3.1)
Lymphs: 25 %
MCH: 32.9 pg (ref 26.6–33.0)
MCHC: 34.5 g/dL (ref 31.5–35.7)
MCV: 95 fL (ref 79–97)
Monocytes Absolute: 0.6 10*3/uL (ref 0.1–0.9)
Monocytes: 8 %
Neutrophils Absolute: 4.8 10*3/uL (ref 1.4–7.0)
Neutrophils: 63 %
Platelets: 173 10*3/uL (ref 150–450)
RBC: 4.35 x10E6/uL (ref 4.14–5.80)
RDW: 12.9 % (ref 11.6–15.4)
WBC: 7.5 10*3/uL (ref 3.4–10.8)

## 2021-01-13 LAB — URIC ACID: Uric Acid: 6.6 mg/dL (ref 3.8–8.4)

## 2021-01-13 LAB — PSA: Prostate Specific Ag, Serum: 1.4 ng/mL (ref 0.0–4.0)

## 2021-01-13 NOTE — Progress Notes (Signed)
New Patient Visit  Subjective  Wayne Goodwin is a 70 y.o. male who presents for the following: check spots (Chest, neck, L medial knee, get irritated, /L cheek, 76m) and Total body skin exam (Hx of Dysplastic nevus, L upper back med to mid scapula). The patient presents for Total-Body Skin Exam (TBSE) for skin cancer screening and mole check.  The following portions of the chart were reviewed this encounter and updated as appropriate:   Tobacco  Allergies  Meds  Problems  Med Hx  Surg Hx  Fam Hx      Review of Systems:  No other skin or systemic complaints except as noted in HPI or Assessment and Plan.  Objective  Well appearing patient in no apparent distress; mood and affect are within normal limits.  A full examination was performed including scalp, head, eyes, ears, nose, lips, neck, chest, axillae, abdomen, back, buttocks, bilateral upper extremities, bilateral lower extremities, hands, feet, fingers, toes, fingernails, and toenails. All findings within normal limits unless otherwise noted below.  Left Upper Back med to mid scapula Scar with no evidence of recurrence.   face and ears x 18 (18) Pink scaly macules   Left medial knee 1.5cm hyperkeratotic pap  bil lower legs Stasis changes  bil great toenails Toenail dystrophy  L cheek Pearly pap 0.7cm  R of midline sternum Pearly pap 1.5cm   Assessment & Plan   Lentigines - Scattered tan macules - Due to sun exposure - Benign-appering, observe - Recommend daily broad spectrum sunscreen SPF 30+ to sun-exposed areas, reapply every 2 hours as needed. - Call for any changes  Seborrheic Keratoses - Stuck-on, waxy, tan-brown papules and/or plaques  - Benign-appearing - Discussed benign etiology and prognosis. - Observe - Call for any changes  Melanocytic Nevi - Tan-brown and/or pink-flesh-colored symmetric macules and papules - Benign appearing on exam today - Observation - Call clinic for new or  changing moles - Recommend daily use of broad spectrum spf 30+ sunscreen to sun-exposed areas.   Hemangiomas - Red papules - Discussed benign nature - Observe - Call for any changes  Actinic Damage - Chronic condition, secondary to cumulative UV/sun exposure - diffuse scaly erythematous macules with underlying dyspigmentation - Recommend daily broad spectrum sunscreen SPF 30+ to sun-exposed areas, reapply every 2 hours as needed.  - Staying in the shade or wearing long sleeves, sun glasses (UVA+UVB protection) and wide brim hats (4-inch brim around the entire circumference of the hat) are also recommended for sun protection.  - Call for new or changing lesions.  Skin cancer screening performed today.  Acrochordons (Skin Tags) - Fleshy, skin-colored pedunculated papules - Benign appearing.  - Observe. - If desired, they can be removed with an in office procedure that is not covered by insurance. - Please call the clinic if you notice any new or changing lesions.   History of dysplastic nevus Left Upper Back med to mid scapula  Clear. Observe for recurrence. Call clinic for new or changing lesions.  Recommend regular skin exams, daily broad-spectrum spf 30+ sunscreen use, and photoprotection.     AK (actinic keratosis) (18) face and ears x 18  Destruction of lesion - face and ears x 18 Complexity: simple   Destruction method: cryotherapy   Informed consent: discussed and consent obtained   Timeout:  patient name, date of birth, surgical site, and procedure verified Lesion destroyed using liquid nitrogen: Yes   Region frozen until ice ball extended beyond lesion: Yes  Outcome: patient tolerated procedure well with no complications   Post-procedure details: wound care instructions given    Prurigo nodularis Left medial knee Prurigo nodule vs other Plan shave removal and bx on f/u  Stasis dermatitis of both legs bil lower legs Benign, observe.    Nail dystrophy bil  great toenails 2ndary to trauma Benign, observe  Neoplasm of skin (2) L cheek Epidermal / dermal shaving  Lesion diameter (cm):  0.7 Informed consent: discussed and consent obtained   Timeout: patient name, date of birth, surgical site, and procedure verified   Procedure prep:  Patient was prepped and draped in usual sterile fashion Prep type:  Isopropyl alcohol Anesthesia: the lesion was anesthetized in a standard fashion   Anesthetic:  1% lidocaine w/ epinephrine 1-100,000 buffered w/ 8.4% NaHCO3 Instrument used: flexible razor blade   Hemostasis achieved with: pressure, aluminum chloride and electrodesiccation   Outcome: patient tolerated procedure well   Post-procedure details: sterile dressing applied and wound care instructions given   Dressing type: bandage and bacitracin    Destruction of lesion Complexity: extensive   Destruction method: electrodesiccation and curettage   Informed consent: discussed and consent obtained   Timeout:  patient name, date of birth, surgical site, and procedure verified Procedure prep:  Patient was prepped and draped in usual sterile fashion Prep type:  Isopropyl alcohol Anesthesia: the lesion was anesthetized in a standard fashion   Anesthetic:  1% lidocaine w/ epinephrine 1-100,000 buffered w/ 8.4% NaHCO3 Curettage performed in three different directions: Yes   Electrodesiccation performed over the curetted area: Yes   Lesion length (cm):  0.7 Lesion width (cm):  0.7 Margin per side (cm):  0.2 Final wound size (cm):  1.1 Hemostasis achieved with:  pressure, aluminum chloride and electrodesiccation Outcome: patient tolerated procedure well with no complications   Post-procedure details: sterile dressing applied and wound care instructions given   Dressing type: bandage and bacitracin    Specimen 1 - Surgical pathology Differential Diagnosis: D48.5 R/O BCC  Check Margins: yes Pearly pap 0.7cm EDC  R of midline sternum  Skin / nail  biopsy Type of biopsy: tangential   Informed consent: discussed and consent obtained   Timeout: patient name, date of birth, surgical site, and procedure verified   Procedure prep:  Patient was prepped and draped in usual sterile fashion Prep type:  Isopropyl alcohol Anesthesia: the lesion was anesthetized in a standard fashion   Anesthetic:  1% lidocaine w/ epinephrine 1-100,000 buffered w/ 8.4% NaHCO3 Instrument used: flexible razor blade   Outcome: patient tolerated procedure well   Post-procedure details: sterile dressing applied and wound care instructions given   Dressing type: bandage and bacitracin    Specimen 2 - Surgical pathology Differential Diagnosis: D48.5 R/O BCC  Check Margins: No Pearly pap 1.5cm  BCC vs other L cheek, bx and edc today,  Discussed EDC vs MOHs if positive, pt would like to txt today with EDC  Skin cancer screening  Return in about 8 weeks (around 03/10/2021) for AK f/u, shave removal Prurigo nodule L med knee.  I, Othelia Pulling, RMA, am acting as scribe for Sarina Ser, MD . Documentation: I have reviewed the above documentation for accuracy and completeness, and I agree with the above.  Sarina Ser, MD

## 2021-01-13 NOTE — Patient Instructions (Signed)
If you have any questions or concerns for your doctor, please call our main line at [phone] and press option 4 to reach your doctor's medical assistant. If no one answers, please leave a voicemail as directed and we will return your call as soon as possible. Messages left after 4 pm will be answered the following business day.   You may also send us a message via MyChart. We typically respond to MyChart messages within 1-2 business days.  For prescription refills, please ask your pharmacy to contact our office. Our fax number is [phone].  If you have an urgent issue when the clinic is closed that cannot wait until the next business day, you can page your doctor at the number below.    Please note that while we do our best to be available for urgent issues outside of office hours, we are not available 24/7.   If you have an urgent issue and are unable to reach us, you may choose to seek medical care at your doctor's office, retail clinic, urgent care center, or emergency room.  If you have a medical emergency, please immediately call 911 or go to the emergency department.  Pager Numbers  - Dr. Kowalski: [phone]  - Dr. Moye: [phone]  - Dr. Stewart: [phone]  In the event of inclement weather, please call our main line at [phone] for an update on the status of any delays or closures.  Dermatology Medication Tips: Please keep the boxes that topical medications come in in order to help keep track of the instructions about where and how to use these. Pharmacies typically print the medication instructions only on the boxes and not directly on the medication tubes.   If your medication is too expensive, please contact our office at [phone] option 4 or send us a message through MyChart.   We are unable to tell what your co-pay for medications will be in advance as this is different depending on your insurance coverage. However, we may be able to find a substitute  medication at lower cost or fill out paperwork to get insurance to cover a needed medication.   If a prior authorization is required to get your medication covered by your insurance company, please allow us 1-2 business days to complete this process.  Drug prices often vary depending on where the prescription is filled and some pharmacies may offer cheaper prices.  The website www.goodrx.com contains coupons for medications through different pharmacies. The prices here do not account for what the cost may be with help from insurance (it may be cheaper with your insurance), but the website can give you the price if you did not use any insurance.  - You can print the associated coupon and take it with your prescription to the pharmacy.  - You may also stop by our office during regular business hours and pick up a GoodRx coupon card.  - If you need your prescription sent electronically to a different pharmacy, notify our office through [facility] MyChart or by phone at [phone] option 4.   Wound Care Instructions  Cleanse wound gently with soap and water once a day then pat dry with clean gauze. Apply a thing coat of Petrolatum (petroleum jelly, "Vaseline") over the wound (unless you have an allergy to this). We recommend that you use a new, sterile tube of Vaseline. Do not pick or remove scabs. Do not remove the yellow or white "healing tissue" from the base of the wound.  Cover the   wound with fresh, clean, nonstick gauze and secure with paper tape. You may use Band-Aids in place of gauze and tape if the would is small enough, but would recommend trimming much of the tape off as there is often too much. Sometimes Band-Aids can irritate the skin.  You should call the office for your biopsy report after 1 week if you have not already been contacted.  If you experience any problems, such as abnormal amounts of bleeding, swelling, significant bruising, significant pain, or evidence of infection,  please call the office immediately.  FOR ADULT SURGERY PATIENTS: If you need something for pain relief you may take 1 extra strength Tylenol (acetaminophen) AND 2 Ibuprofen (200mg each) together every 4 hours as needed for pain. (do not take these if you are allergic to them or if you have a reason you should not take them.) Typically, you may only need pain medication for 1 to 3 days.    

## 2021-01-14 MED ORDER — PHENTERMINE HCL 37.5 MG PO TABS: 37.5000 mg | ORAL_TABLET | Freq: Every morning | ORAL | 3 refills | Status: DC

## 2021-01-18 ENCOUNTER — Other Ambulatory Visit: Payer: Self-pay | Admitting: Family Medicine

## 2021-01-18 DIAGNOSIS — N401 Enlarged prostate with lower urinary tract symptoms: Secondary | ICD-10-CM

## 2021-01-19 ENCOUNTER — Encounter: Payer: Self-pay | Admitting: Dermatology

## 2021-01-19 ENCOUNTER — Telehealth: Payer: Self-pay

## 2021-01-19 NOTE — Telephone Encounter (Signed)
-----   Message from Ralene Bathe, MD sent at 01/19/2021 12:37 PM EDT ----- Diagnosis 1. Skin , L cheek BASAL CELL CARCINOMA, NODULAR PATTERN, BASE INVOLVED 2. Skin , R of midline sternum BASAL CELL CARCINOMA, NODULAR PATTERN, BASE INVOLVED  1- Cancer - BCC As suspected Already treated Recheck next visit 2- Cancer - BCC Schedule surgery

## 2021-01-19 NOTE — Telephone Encounter (Signed)
Advised patient of results and scheduled for excision/hd

## 2021-01-30 ENCOUNTER — Other Ambulatory Visit: Payer: Self-pay | Admitting: Family Medicine

## 2021-01-30 DIAGNOSIS — M10041 Idiopathic gout, right hand: Secondary | ICD-10-CM

## 2021-01-30 NOTE — Telephone Encounter (Signed)
Requested Prescriptions  Pending Prescriptions Disp Refills  . allopurinol (ZYLOPRIM) 300 MG tablet [Pharmacy Med Name: ALLOPURINOL 300 MG TAB] 90 tablet 0    Sig: TAKE 1 TABLET BY MOUTH DAILY     Endocrinology:  Gout Agents Passed - 01/30/2021 10:23 AM      Passed - Uric Acid in normal range and within 360 days    Uric Acid  Date Value Ref Range Status  01/12/2021 6.6 3.8 - 8.4 mg/dL Final    Comment:               Therapeutic target for gout patients: <6.0         Passed - Cr in normal range and within 360 days    Creat  Date Value Ref Range Status  05/01/2017 0.89 0.70 - 1.25 mg/dL Final    Comment:    For patients >38 years of age, the reference limit for Creatinine is approximately 13% higher for people identified as African-American. .    Creatinine, Ser  Date Value Ref Range Status  01/12/2021 1.01 0.76 - 1.27 mg/dL Final         Passed - Valid encounter within last 12 months    Recent Outpatient Visits          2 weeks ago Annual physical exam   [facility] Jerrol Banana., MD   1 year ago Annual physical exam   [facility] Jerrol Banana., MD   2 years ago Annual physical exam   [facility] Jerrol Banana., MD   3 years ago Obesity (BMI 35.0-39.9 without comorbidity)   [facility] Jerrol Banana., MD   3 years ago Obesity (BMI 35.0-39.9 without comorbidity)   [facility] Jerrol Banana., MD      Future Appointments            In 1 month Ralene Bathe, MD [place]   In 2 months Jerrol Banana., MD [facility], [place]   In 2 months Jerrol Banana., MD [facility], [place]

## 2021-02-04 ENCOUNTER — Telehealth: Payer: Self-pay | Admitting: Family Medicine

## 2021-02-04 NOTE — Telephone Encounter (Signed)
Patient called, left VM to call the office for results.

## 2021-02-04 NOTE — Telephone Encounter (Signed)
Attempted to call patient back with lab results: Wayne Goodwin., MD  01/18/2021  8:21 AM EDT      Labs stable. Please advise patient.  Work on diet and exercise as discussed is mild fatty liver and high triglycerides are probably because of elevated liver function.  Would follow-up in a few months and repeat some labs and see howpatient is doing with weight.  Left message to call office.

## 2021-02-04 NOTE — Telephone Encounter (Signed)
Copied from [place] [id number]. Topic: Quick Communication - Lab Results (Clinic Use ONLY) >> Jan 18, 2021 10:51 AM Doristine Devoid, CMA wrote: Called patient to inform them of  lab results. When patient returns call, triage nurse may disclose results. >> Feb 04, 2021 10:26 AM Celene Kras wrote: Pt calling in to get his lab results. Attempted Triage. Please advise.

## 2021-02-08 NOTE — Telephone Encounter (Signed)
Attempted to call patient with lab result- left message to call office.

## 2021-02-08 NOTE — Telephone Encounter (Signed)
L/M advising that he may get labs during visit in Sept.

## 2021-02-08 NOTE — Telephone Encounter (Signed)
This encounter was created in error - please disregard.

## 2021-02-08 NOTE — Telephone Encounter (Signed)
Pt given lab results per notes of Dr. Rosanna Randy on 01/18/2021.Marland Kitchen Pt verbalized understanding. Lab order needs to be put in and pt has upcoming appt in September and he was wondering if he could get labs drawn at that time.

## 2021-02-24 ENCOUNTER — Other Ambulatory Visit: Payer: Self-pay | Admitting: Family Medicine

## 2021-02-24 DIAGNOSIS — E7800 Pure hypercholesterolemia, unspecified: Secondary | ICD-10-CM

## 2021-03-03 ENCOUNTER — Other Ambulatory Visit: Payer: Self-pay | Admitting: Family Medicine

## 2021-03-03 DIAGNOSIS — G4700 Insomnia, unspecified: Secondary | ICD-10-CM

## 2021-03-03 NOTE — Telephone Encounter (Signed)
Requested medications are due for refill today yes  Requested medications are on the active medication list yes  Last refill 7/1  Last visit annual visit 6/8, insomnia not specifically addressed.  Future visit scheduled 9/6  Notes to clinic Not Delegated.

## 2021-03-07 ENCOUNTER — Other Ambulatory Visit: Payer: Self-pay | Admitting: Family Medicine

## 2021-03-07 DIAGNOSIS — G4700 Insomnia, unspecified: Secondary | ICD-10-CM

## 2021-03-08 ENCOUNTER — Other Ambulatory Visit: Payer: Self-pay | Admitting: Family Medicine

## 2021-03-08 ENCOUNTER — Encounter: Payer: BC Managed Care – PPO | Admitting: Dermatology

## 2021-03-08 DIAGNOSIS — I10 Essential (primary) hypertension: Secondary | ICD-10-CM

## 2021-03-10 ENCOUNTER — Ambulatory Visit: Payer: BC Managed Care – PPO | Admitting: Dermatology

## 2021-03-15 ENCOUNTER — Ambulatory Visit: Payer: BC Managed Care – PPO | Admitting: Dermatology

## 2021-04-05 ENCOUNTER — Other Ambulatory Visit: Payer: Self-pay | Admitting: Family Medicine

## 2021-04-05 DIAGNOSIS — G4700 Insomnia, unspecified: Secondary | ICD-10-CM

## 2021-04-05 NOTE — Telephone Encounter (Signed)
Requested medications are due for refill today.  yes  Requested medications are on the active medications list.  yes  Last refill. 03/07/2021  Future visit scheduled.   yes  Notes to clinic.  Medication not delegated. 

## 2021-04-12 ENCOUNTER — Ambulatory Visit: Payer: Self-pay | Admitting: Family Medicine

## 2021-04-15 ENCOUNTER — Other Ambulatory Visit: Payer: Self-pay | Admitting: Family Medicine

## 2021-04-15 DIAGNOSIS — N138 Other obstructive and reflux uropathy: Secondary | ICD-10-CM

## 2021-04-15 DIAGNOSIS — N401 Enlarged prostate with lower urinary tract symptoms: Secondary | ICD-10-CM

## 2021-04-16 NOTE — Telephone Encounter (Signed)
Requested Prescriptions  Pending Prescriptions Disp Refills  . tamsulosin (FLOMAX) 0.4 MG CAPS capsule [Pharmacy Med Name: TAMSULOSIN HCL 0.4 MG CAP] 90 capsule 2    Sig: TAKE 1 CAPSULE BY MOUTH ONCE DAILY     Urology: Alpha-Adrenergic Blocker Passed - 04/15/2021  2:28 PM      Passed - Last BP in normal range    BP Readings from Last 1 Encounters:  01/12/21 139/86         Passed - Valid encounter within last 12 months    Recent Outpatient Visits          3 months ago Annual physical exam   [facility] Jerrol Banana., MD   1 year ago Annual physical exam   [facility] Jerrol Banana., MD   2 years ago Annual physical exam   [facility] Jerrol Banana., MD   3 years ago Obesity (BMI 35.0-39.9 without comorbidity)   [facility] Jerrol Banana., MD   3 years ago Obesity (BMI 35.0-39.9 without comorbidity)   [facility] Jerrol Banana., MD      Future Appointments            In 1 month Ralene Bathe, MD [place]   In 1 month Jerrol Banana., MD [facility], [place]

## 2021-04-20 ENCOUNTER — Ambulatory Visit: Payer: Self-pay | Admitting: Family Medicine

## 2021-04-26 ENCOUNTER — Other Ambulatory Visit: Payer: Self-pay

## 2021-04-26 ENCOUNTER — Encounter (INDEPENDENT_AMBULATORY_CARE_PROVIDER_SITE_OTHER): Payer: BC Managed Care – PPO | Admitting: Ophthalmology

## 2021-04-26 DIAGNOSIS — H35033 Hypertensive retinopathy, bilateral: Secondary | ICD-10-CM

## 2021-04-26 DIAGNOSIS — I10 Essential (primary) hypertension: Secondary | ICD-10-CM

## 2021-04-26 DIAGNOSIS — H35373 Puckering of macula, bilateral: Secondary | ICD-10-CM | POA: Diagnosis not present

## 2021-04-26 DIAGNOSIS — H338 Other retinal detachments: Secondary | ICD-10-CM | POA: Diagnosis not present

## 2021-04-26 DIAGNOSIS — H43813 Vitreous degeneration, bilateral: Secondary | ICD-10-CM

## 2021-05-03 ENCOUNTER — Encounter (INDEPENDENT_AMBULATORY_CARE_PROVIDER_SITE_OTHER): Payer: BC Managed Care – PPO | Admitting: Ophthalmology

## 2021-05-04 ENCOUNTER — Telehealth: Payer: Self-pay | Admitting: Family Medicine

## 2021-05-04 ENCOUNTER — Telehealth: Payer: Self-pay

## 2021-05-04 NOTE — Telephone Encounter (Addendum)
Pt is calling and would like  dr Rosanna Randy advise. Pt has been having ongoing issue with left knee pain and has gotten worst in last few days and was dx once before with arthritis and would like to know if dr Rosanna Randy thinks he should see orthopedic doctor or see dr Rosanna Randy. Pt was given mobic in the past and did not really take the medication. Pt states dr Rosanna Randy is a friend of his and did not want to talk with triage nurse. Pt does have [employer]

## 2021-05-04 NOTE — Telephone Encounter (Signed)
Patient called regarding surgery next week. He currently has pain or arthritis flare in his knee and having to take pain killers this week. He is going to see orthopedic but wanted you to be aware and did not want the current medications to cause issues for you next week. Okay to keep surgery appointment taking these medications one week prior?

## 2021-05-05 NOTE — Telephone Encounter (Signed)
Left msg for patient to return my call.  Jethro Bolus or [place] - Can someone try to reach out to patient tomorrow?

## 2021-05-05 NOTE — Telephone Encounter (Signed)
Please advise 

## 2021-05-06 ENCOUNTER — Telehealth: Payer: Self-pay

## 2021-05-06 ENCOUNTER — Other Ambulatory Visit: Payer: Self-pay | Admitting: *Deleted

## 2021-05-06 NOTE — Telephone Encounter (Signed)
Called pt, he report he has been taking Ibuprofen for arthritis in his knee, but he has now stopped, he will keep his surgery appt here on Tuesday

## 2021-05-06 NOTE — Telephone Encounter (Signed)
LMOVM for pt to return call. Okay for pec triage to advise.

## 2021-05-06 NOTE — Telephone Encounter (Signed)
Left message on voicemail to return my call.  

## 2021-05-10 ENCOUNTER — Other Ambulatory Visit: Payer: Self-pay

## 2021-05-10 ENCOUNTER — Ambulatory Visit: Payer: BC Managed Care – PPO | Admitting: Dermatology

## 2021-05-10 ENCOUNTER — Encounter: Payer: Self-pay | Admitting: Dermatology

## 2021-05-10 DIAGNOSIS — D225 Melanocytic nevi of trunk: Secondary | ICD-10-CM

## 2021-05-10 DIAGNOSIS — L905 Scar conditions and fibrosis of skin: Secondary | ICD-10-CM | POA: Diagnosis not present

## 2021-05-10 DIAGNOSIS — C44719 Basal cell carcinoma of skin of left lower limb, including hip: Secondary | ICD-10-CM

## 2021-05-10 DIAGNOSIS — L578 Other skin changes due to chronic exposure to nonionizing radiation: Secondary | ICD-10-CM | POA: Diagnosis not present

## 2021-05-10 DIAGNOSIS — L570 Actinic keratosis: Secondary | ICD-10-CM

## 2021-05-10 DIAGNOSIS — C44519 Basal cell carcinoma of skin of other part of trunk: Secondary | ICD-10-CM | POA: Diagnosis not present

## 2021-05-10 DIAGNOSIS — D485 Neoplasm of uncertain behavior of skin: Secondary | ICD-10-CM

## 2021-05-10 DIAGNOSIS — C4491 Basal cell carcinoma of skin, unspecified: Secondary | ICD-10-CM

## 2021-05-10 HISTORY — DX: Basal cell carcinoma of skin, unspecified: C44.91

## 2021-05-10 NOTE — Progress Notes (Signed)
Follow-Up Visit   Subjective  Wayne Goodwin is a 70 y.o. male who presents for the following: Procedure (Biopsy proven BCC of right of midline sternum -treat today). He also has several other areas to be evaluated today.  The following portions of the chart were reviewed this encounter and updated as appropriate:   Tobacco  Allergies  Meds  Problems  Med Hx  Surg Hx  Fam Hx     Review of Systems:  No other skin or systemic complaints except as noted in HPI or Assessment and Plan.  Objective  Well appearing patient in no apparent distress; mood and affect are within normal limits.  A focused examination was performed including chest. Relevant physical exam findings are noted in the Assessment and Plan.  Right of midline sternum Healing biopsy site       Right of midline sternum sup 1.2 cm pink papule       Left medial popliteal 1.2 cm crusted pink papule  Assessment & Plan  Basal cell carcinoma (BCC) of skin of other part of torso Right of midline sternum  Destruction of lesion Complexity: extensive   Destruction method: electrodesiccation and curettage   Informed consent: discussed and consent obtained   Timeout:  patient name, date of birth, surgical site, and procedure verified Procedure prep:  Patient was prepped and draped in usual sterile fashion Prep type:  Isopropyl alcohol Anesthesia: the lesion was anesthetized in a standard fashion   Anesthetic:  1% lidocaine w/ epinephrine 1-100,000 buffered w/ 8.4% NaHCO3 Curettage performed in three different directions: Yes   Electrodesiccation performed over the curetted area: Yes   Final wound size (cm):  2.2 Hemostasis achieved with:  pressure and aluminum chloride Outcome: patient tolerated procedure well with no complications   Post-procedure details: sterile dressing applied and wound care instructions given   Dressing type: bandage and petrolatum    Neoplasm of uncertain behavior of skin  (2) Right of midline sternum sup  Epidermal / dermal shaving  Lesion diameter (cm):  1.2 Informed consent: discussed and consent obtained   Timeout: patient name, date of birth, surgical site, and procedure verified   Procedure prep:  Patient was prepped and draped in usual sterile fashion Prep type:  Isopropyl alcohol Anesthesia: the lesion was anesthetized in a standard fashion   Anesthetic:  1% lidocaine w/ epinephrine 1-100,000 buffered w/ 8.4% NaHCO3 Instrument used: flexible razor blade   Hemostasis achieved with: pressure, aluminum chloride and electrodesiccation   Outcome: patient tolerated procedure well   Post-procedure details: sterile dressing applied and wound care instructions given   Dressing type: bandage and petrolatum    Destruction of lesion Complexity: extensive   Destruction method: electrodesiccation and curettage   Informed consent: discussed and consent obtained   Timeout:  patient name, date of birth, surgical site, and procedure verified Procedure prep:  Patient was prepped and draped in usual sterile fashion Prep type:  Isopropyl alcohol Anesthesia: the lesion was anesthetized in a standard fashion   Anesthetic:  1% lidocaine w/ epinephrine 1-100,000 buffered w/ 8.4% NaHCO3 Curettage performed in three different directions: Yes   Electrodesiccation performed over the curetted area: Yes   Lesion length (cm):  1.2 Lesion width (cm):  1.2 Margin per side (cm):  0.2 Final wound size (cm):  1.6 Hemostasis achieved with:  pressure and aluminum chloride Outcome: patient tolerated procedure well with no complications   Post-procedure details: sterile dressing applied and wound care instructions given   Dressing type: bandage  and petrolatum    Specimen 1 - Surgical pathology Differential Diagnosis: BCC vs other  Check Margins: No EDC today  Left medial popliteal  Epidermal / dermal shaving  Lesion diameter (cm):  1.2 Informed consent: discussed and  consent obtained   Timeout: patient name, date of birth, surgical site, and procedure verified   Procedure prep:  Patient was prepped and draped in usual sterile fashion Prep type:  Isopropyl alcohol Anesthesia: the lesion was anesthetized in a standard fashion   Anesthetic:  1% lidocaine w/ epinephrine 1-100,000 buffered w/ 8.4% NaHCO3 Instrument used: flexible razor blade   Hemostasis achieved with: pressure, aluminum chloride and electrodesiccation   Outcome: patient tolerated procedure well   Post-procedure details: sterile dressing applied and wound care instructions given   Dressing type: bandage and petrolatum    Destruction of lesion Complexity: extensive   Destruction method: electrodesiccation and curettage   Informed consent: discussed and consent obtained   Timeout:  patient name, date of birth, surgical site, and procedure verified Procedure prep:  Patient was prepped and draped in usual sterile fashion Prep type:  Isopropyl alcohol Anesthesia: the lesion was anesthetized in a standard fashion   Anesthetic:  1% lidocaine w/ epinephrine 1-100,000 buffered w/ 8.4% NaHCO3 Curettage performed in three different directions: Yes   Electrodesiccation performed over the curetted area: Yes   Lesion length (cm):  1.2 Lesion width (cm):  1.2 Margin per side (cm):  0.2 Final wound size (cm):  1.6 Hemostasis achieved with:  pressure and aluminum chloride Outcome: patient tolerated procedure well with no complications   Post-procedure details: sterile dressing applied and wound care instructions given   Dressing type: bandage and petrolatum    Specimen 2 - Surgical pathology Differential Diagnosis: Prurigo Nodule R/O Ca Check Margins: No EDC today  AK (actinic keratosis) (16) Face  Destruction of lesion - Face Complexity: simple   Destruction method: cryotherapy   Informed consent: discussed and consent obtained   Timeout:  patient name, date of birth, surgical site, and  procedure verified Lesion destroyed using liquid nitrogen: Yes   Region frozen until ice ball extended beyond lesion: Yes   Outcome: patient tolerated procedure well with no complications   Post-procedure details: wound care instructions given    Actinic Damage - chronic, secondary to cumulative UV radiation exposure/sun exposure over time - diffuse scaly erythematous macules with underlying dyspigmentation - Recommend daily broad spectrum sunscreen SPF 30+ to sun-exposed areas, reapply every 2 hours as needed.  - Recommend staying in the shade or wearing long sleeves, sun glasses (UVA+UVB protection) and wide brim hats (4-inch brim around the entire circumference of the hat). - Call for new or changing lesions.  Return in about 6 months (around 11/08/2021).  I, Ashok Cordia, CMA, am acting as scribe for Sarina Ser, MD . Documentation: I have reviewed the above documentation for accuracy and completeness, and I agree with the above.  Sarina Ser, MD

## 2021-05-10 NOTE — Patient Instructions (Addendum)
Cryotherapy Aftercare  Wash gently with soap and water everyday.   Apply Vaseline and Band-Aid daily until healed.    Wound Care Instructions  Cleanse wound gently with soap and water once a day then pat dry with clean gauze. Apply a thing coat of Petrolatum (petroleum jelly, "Vaseline") over the wound (unless you have an allergy to this). We recommend that you use a new, sterile tube of Vaseline. Do not pick or remove scabs. Do not remove the yellow or white "healing tissue" from the base of the wound.  Cover the wound with fresh, clean, nonstick gauze and secure with paper tape. You may use Band-Aids in place of gauze and tape if the would is small enough, but would recommend trimming much of the tape off as there is often too much. Sometimes Band-Aids can irritate the skin.  You should call the office for your biopsy report after 1 week if you have not already been contacted.  If you experience any problems, such as abnormal amounts of bleeding, swelling, significant bruising, significant pain, or evidence of infection, please call the office immediately.  FOR ADULT SURGERY PATIENTS: If you need something for pain relief you may take 1 extra strength Tylenol (acetaminophen) AND 2 Ibuprofen (200mg each) together every 4 hours as needed for pain. (do not take these if you are allergic to them or if you have a reason you should not take them.) Typically, you may only need pain medication for 1 to 3 days.     If you have any questions or concerns for your doctor, please call our main line at [phone] and press option 4 to reach your doctor's medical assistant. If no one answers, please leave a voicemail as directed and we will return your call as soon as possible. Messages left after 4 pm will be answered the following business day.   You may also send us a message via MyChart. We typically respond to MyChart messages within 1-2 business days.  For prescription refills, please ask your  pharmacy to contact our office. Our fax number is [phone].  If you have an urgent issue when the clinic is closed that cannot wait until the next business day, you can page your doctor at the number below.    Please note that while we do our best to be available for urgent issues outside of office hours, we are not available 24/7.   If you have an urgent issue and are unable to reach us, you may choose to seek medical care at your doctor's office, retail clinic, urgent care center, or emergency room.  If you have a medical emergency, please immediately call 911 or go to the emergency department.  Pager Numbers  - Dr. Kowalski: [phone]  - Dr. Moye: [phone]  - Dr. Stewart: [phone]  In the event of inclement weather, please call our main line at [phone] for an update on the status of any delays or closures.  Dermatology Medication Tips: Please keep the boxes that topical medications come in in order to help keep track of the instructions about where and how to use these. Pharmacies typically print the medication instructions only on the boxes and not directly on the medication tubes.   If your medication is too expensive, please contact our office at [phone] option 4 or send us a message through MyChart.   We are unable to tell what your co-pay for medications will be in advance as this is different depending on your insurance coverage.   However, we may be able to find a substitute medication at lower cost or fill out paperwork to get insurance to cover a needed medication.   If a prior authorization is required to get your medication covered by your insurance company, please allow us 1-2 business days to complete this process.  Drug prices often vary depending on where the prescription is filled and some pharmacies may offer cheaper prices.  The website www.goodrx.com contains coupons for medications through different pharmacies. The prices here do not  account for what the cost may be with help from insurance (it may be cheaper with your insurance), but the website can give you the price if you did not use any insurance.  - You can print the associated coupon and take it with your prescription to the pharmacy.  - You may also stop by our office during regular business hours and pick up a GoodRx coupon card.  - If you need your prescription sent electronically to a different pharmacy, notify our office through [facility] MyChart or by phone at [phone] option 4.  

## 2021-05-11 NOTE — Telephone Encounter (Signed)
LMOVM for pt to cb. Okay for [facility] triage to advise pt below.

## 2021-05-17 ENCOUNTER — Ambulatory Visit: Payer: BC Managed Care – PPO | Admitting: Dermatology

## 2021-05-17 ENCOUNTER — Telehealth: Payer: Self-pay

## 2021-05-17 NOTE — Telephone Encounter (Signed)
Left message on voicemail to return my call.  

## 2021-05-17 NOTE — Telephone Encounter (Signed)
-----   Message from Ralene Bathe, MD sent at 05/16/2021  5:45 PM EDT ----- Diagnosis 1. Skin , right midline sternum sup SUPERFICIAL BASAL CELL CARCINOMA AND DYSPLASTIC NEVUS WITH MODERATE TO SEVERE ATYPIA WITH FOCAL SCAR, DEEP MARGIN INVOLVED, SEE DESCRIPTION 2. Skin , left medial popliteal BASAL CELL CARCINOMA, NODULAR PATTERN, ULCERATED  1- Cancer - BCC with Severe dysplastic Nevus Already treated Recheck next visit May need further treatment if evidence of recurrence 2- Cancer - BCC Already treated Recheck next visit Some risk of recurrence but low and may need additional treatment if recurs

## 2021-05-18 ENCOUNTER — Telehealth: Payer: Self-pay

## 2021-05-18 NOTE — Telephone Encounter (Signed)
-----   Message from Ralene Bathe, MD sent at 05/16/2021  5:45 PM EDT ----- Diagnosis 1. Skin , right midline sternum sup SUPERFICIAL BASAL CELL CARCINOMA AND DYSPLASTIC NEVUS WITH MODERATE TO SEVERE ATYPIA WITH FOCAL SCAR, DEEP MARGIN INVOLVED, SEE DESCRIPTION 2. Skin , left medial popliteal BASAL CELL CARCINOMA, NODULAR PATTERN, ULCERATED  1- Cancer - BCC with Severe dysplastic Nevus Already treated Recheck next visit May need further treatment if evidence of recurrence 2- Cancer - BCC Already treated Recheck next visit Some risk of recurrence but low and may need additional treatment if recurs

## 2021-05-18 NOTE — Telephone Encounter (Signed)
Patient advised of BX results. Will call if needed before next April. aw

## 2021-06-06 ENCOUNTER — Other Ambulatory Visit: Payer: Self-pay | Admitting: Family Medicine

## 2021-06-06 DIAGNOSIS — G4700 Insomnia, unspecified: Secondary | ICD-10-CM

## 2021-06-06 NOTE — Telephone Encounter (Signed)
Requested medications are due for refill today.  yes  Requested medications are on the active medications list.  yes  Last refill. 04/12/2021  Future visit scheduled.   yes  Notes to clinic.  Medication not delegated.

## 2021-06-07 ENCOUNTER — Ambulatory Visit: Payer: BC Managed Care – PPO | Admitting: Family Medicine

## 2021-06-07 ENCOUNTER — Other Ambulatory Visit: Payer: Self-pay

## 2021-06-07 VITALS — BP 125/87 | HR 89 | Temp 98.1°F | Wt 282.0 lb

## 2021-06-07 DIAGNOSIS — I10 Essential (primary) hypertension: Secondary | ICD-10-CM | POA: Diagnosis not present

## 2021-06-07 DIAGNOSIS — Z8546 Personal history of malignant neoplasm of prostate: Secondary | ICD-10-CM

## 2021-06-07 DIAGNOSIS — E7800 Pure hypercholesterolemia, unspecified: Secondary | ICD-10-CM

## 2021-06-07 DIAGNOSIS — Z6838 Body mass index (BMI) 38.0-38.9, adult: Secondary | ICD-10-CM

## 2021-06-07 DIAGNOSIS — C4491 Basal cell carcinoma of skin, unspecified: Secondary | ICD-10-CM

## 2021-06-07 DIAGNOSIS — Z23 Encounter for immunization: Secondary | ICD-10-CM

## 2021-06-07 DIAGNOSIS — G4700 Insomnia, unspecified: Secondary | ICD-10-CM

## 2021-06-07 NOTE — Progress Notes (Signed)
    Established patient visit   Patient: Wayne Goodwin   DOB: 12/13/1950   69 y.o. Male  MRN: [id number] Visit Date: 06/07/2021  Today's healthcare provider: Richard Gilbert Jr, MD   No chief complaint on file.  Subjective    HPI  Patient feels well and has been working on dietary changes. He has no complaints other than arthritic pains.  Especially in the knees.  No gout flares. He says he can tell when barometric pressure  is changing as his knee is really ache. He still uses occasional Ambien for sleep. He has had several basal cell carcinomas removed by Dr. Kowalski from dermatology. Hypertension, follow-up  BP Readings from Last 3 Encounters:  06/07/21 125/87  01/12/21 139/86  09/16/19 (!) 143/88   Wt Readings from Last 3 Encounters:  06/07/21 282 lb (127.9 kg)  01/12/21 298 lb (135.2 kg)  09/16/19 286 lb (129.7 kg)     He was last seen for hypertension 3 months ago.  BP at that visit was as above. Management since that visit includes none.  He reports good compliance with treatment. He is not having side effects.    Use of agents associated with hypertension: amphetamines and NSAIDS.   Outside blood pressures are not being checked. Symptoms: No chest pain No chest pressure  No palpitations No syncope  No dyspnea No orthopnea  No paroxysmal nocturnal dyspnea No lower extremity edema   Pertinent labs: Lab Results  Component Value Date   CHOL 172 01/12/2021   HDL 39 (L) 01/12/2021   LDLCALC 74 01/12/2021   TRIG 374 (H) 01/12/2021   CHOLHDL 4.4 01/12/2021   Lab Results  Component Value Date   NA 141 01/12/2021   K 4.3 01/12/2021   CREATININE 1.01 01/12/2021   EGFR 81 01/12/2021   GLUCOSE 135 (H) 01/12/2021   TSH 2.000 01/12/2021     The 10-year ASCVD risk score (Arnett DK, et al., 2019) is: 19.4%   ---------------------------------------------------------------------------------------------------     Medications:    Review of  Systems      Objective    BP 125/87 (BP Location: Right Arm, Patient Position: Sitting, Cuff Size: Large)   Pulse 89   Temp 98.1 F (36.7 C) (Oral)   Wt 282 lb (127.9 kg)   SpO2 94%   BMI 38.25 kg/m  BP Readings from Last 3 Encounters:  06/07/21 125/87  01/12/21 139/86  09/16/19 (!) 143/88   Wt Readings from Last 3 Encounters:  06/07/21 282 lb (127.9 kg)  01/12/21 298 lb (135.2 kg)  09/16/19 286 lb (129.7 kg)      Physical Exam Vitals reviewed.  Constitutional:      General: He is not in acute distress.    Appearance: He is well-developed.  HENT:     Head: Normocephalic and atraumatic.     Right Ear: Hearing normal.     Left Ear: Hearing normal.     Nose: Nose normal.  Eyes:     General: Lids are normal. No scleral icterus.       Right eye: No discharge.        Left eye: No discharge.     Conjunctiva/sclera: Conjunctivae normal.  Cardiovascular:     Rate and Rhythm: Normal rate and regular rhythm.     Heart sounds: Normal heart sounds.  Pulmonary:     Effort: Pulmonary effort is normal. No respiratory distress.  Skin:    Findings: No lesion or rash.  Neurological:       General: No focal deficit present.     Mental Status: He is alert and oriented to person, place, and time.  Psychiatric:        Mood and Affect: Mood normal.        Speech: Speech normal.        Behavior: Behavior normal.        Thought Content: Thought content normal.        Judgment: Judgment normal.      No results found for any visits on 06/07/21.  Assessment & Plan     1. Essential hypertension Good control on Lotrel  2. Need for influenza vaccination  - Flu Vaccine QUAD High Dose(Fluad)  3. Need for shingles vaccine  - Varicella-zoster vaccine IM (Shingrix)  4. Class 2 severe obesity due to excess calories with serious comorbidity and body mass index (BMI) of 38.0 to 38.9 in adult (HCC) Patient is lost 16 pounds since his last visit with diet and exercise.  Continue to  work hard on this.  Would love to see him lose another 15 to 20 pounds before his next visit  5. History of prostate cancer   6. Insomnia, persistent On Ambien as needed  7. Basal cell carcinoma (BCC), unspecified site Recently removed basal cells 8.  Hypercholesterolemia On crestor  No follow-ups on file.      I, Richard Gilbert Jr, MD, have reviewed all documentation for this visit. The documentation on 06/07/21 for the exam, diagnosis, procedures, and orders are all accurate and complete.    Richard Gilbert Jr, MD  [facility] [phone] (phone) [phone] (fax)  [facility]  

## 2021-06-13 ENCOUNTER — Other Ambulatory Visit: Payer: Self-pay | Admitting: Family Medicine

## 2021-06-13 DIAGNOSIS — I10 Essential (primary) hypertension: Secondary | ICD-10-CM

## 2021-07-07 ENCOUNTER — Other Ambulatory Visit: Payer: Self-pay | Admitting: Family Medicine

## 2021-07-09 NOTE — Telephone Encounter (Signed)
Requested medication (s) are due for refill today: yes  Requested medication (s) are on the active medication list: yes  Last refill:  06/04/21  Future visit scheduled: 12/08/21  Notes to clinic:  This medication can not be delegated, please assess.     Requested Prescriptions  Pending Prescriptions Disp Refills   phentermine (ADIPEX-P) 37.5 MG tablet [Pharmacy Med Name: PHENTERMINE HCL 37.5 MG TAB] 30 tablet     Sig: TAKE 1 TABLET BY MOUTH DAILY FOR APPETITE SUPPRESSION     Not Delegated - Gastroenterology:  Antiobesity Agents Failed - 07/07/2021  6:41 PM      Failed - This refill cannot be delegated      Passed - Last BP in normal range    BP Readings from Last 1 Encounters:  06/07/21 125/87          Passed - Last Heart Rate in normal range    Pulse Readings from Last 1 Encounters:  06/07/21 89          Passed - Valid encounter within last 12 months    Recent Outpatient Visits           1 month ago Essential hypertension   [facility] Jerrol Banana., MD   5 months ago Annual physical exam   [facility] Jerrol Banana., MD   1 year ago Annual physical exam   [facility] Jerrol Banana., MD   2 years ago Annual physical exam   [facility] Jerrol Banana., MD   3 years ago Obesity (BMI 35.0-39.9 without comorbidity)   [facility] Jerrol Banana., MD       Future Appointments             In 4 months Ralene Bathe, MD [place]   In 5 months Jerrol Banana., MD [facility] [place]

## 2021-07-11 NOTE — Telephone Encounter (Signed)
LOV:   06/07/2021   Thanks,   -Mickel Baas

## 2021-08-03 ENCOUNTER — Other Ambulatory Visit: Payer: Self-pay | Admitting: Family Medicine

## 2021-08-03 DIAGNOSIS — I10 Essential (primary) hypertension: Secondary | ICD-10-CM

## 2021-08-03 DIAGNOSIS — M10041 Idiopathic gout, right hand: Secondary | ICD-10-CM

## 2021-08-03 NOTE — Telephone Encounter (Signed)
Requested Prescriptions  Pending Prescriptions Disp Refills   allopurinol (ZYLOPRIM) 300 MG tablet [Pharmacy Med Name: ALLOPURINOL 300 MG TAB] 90 tablet 0    Sig: TAKE 1 TABLET BY MOUTH DAILY     Endocrinology:  Gout Agents Passed - 08/03/2021  8:07 PM      Passed - Uric Acid in normal range and within 360 days    Uric Acid  Date Value Ref Range Status  01/12/2021 6.6 3.8 - 8.4 mg/dL Final    Comment:               Therapeutic target for gout patients: <6.0         Passed - Cr in normal range and within 360 days    Creat  Date Value Ref Range Status  05/01/2017 0.89 0.70 - 1.25 mg/dL Final    Comment:    For patients >90 years of age, the reference limit for Creatinine is approximately 13% higher for people identified as African-American. .    Creatinine, Ser  Date Value Ref Range Status  01/12/2021 1.01 0.76 - 1.27 mg/dL Final         Passed - Valid encounter within last 12 months    Recent Outpatient Visits          1 month ago Essential hypertension   [facility] Jerrol Banana., MD   6 months ago Annual physical exam   [facility] Jerrol Banana., MD   1 year ago Annual physical exam   [facility] Jerrol Banana., MD   2 years ago Annual physical exam   [facility] Jerrol Banana., MD   3 years ago Obesity (BMI 35.0-39.9 without comorbidity)   [facility] Jerrol Banana., MD      Future Appointments            In 3 months Jerrol Banana., MD [facility] [place]   In 3 months Ralene Bathe, MD [place]

## 2021-08-29 ENCOUNTER — Other Ambulatory Visit: Payer: Self-pay | Admitting: Family Medicine

## 2021-08-29 DIAGNOSIS — E7800 Pure hypercholesterolemia, unspecified: Secondary | ICD-10-CM

## 2021-10-03 ENCOUNTER — Other Ambulatory Visit: Payer: Self-pay | Admitting: Family Medicine

## 2021-10-13 ENCOUNTER — Other Ambulatory Visit: Payer: Self-pay | Admitting: Family Medicine

## 2021-10-13 DIAGNOSIS — N401 Enlarged prostate with lower urinary tract symptoms: Secondary | ICD-10-CM

## 2021-10-13 NOTE — Telephone Encounter (Signed)
Requested Prescriptions  ?Pending Prescriptions Disp Refills  ?? tamsulosin (FLOMAX) 0.4 MG CAPS capsule [Pharmacy Med Name: TAMSULOSIN HCL 0.4 MG CAP] 90 capsule 0  ?  Sig: TAKE 1 CAPSULE BY MOUTH ONCE DAILY  ?  ? Urology: Alpha-Adrenergic Blocker Passed - 10/13/2021  4:56 PM  ?  ?  Passed - PSA in normal range and within 360 days  ?  Prostate Specific Ag, Serum  ?Date Value Ref Range Status  ?01/12/2021 1.4 0.0 - 4.0 ng/mL Final  ?  Comment:  ?  Roche ECLIA methodology. ?According to the American Urological Association, Serum PSA should ?decrease and remain at undetectable levels after radical ?prostatectomy. The AUA defines biochemical recurrence as an initial ?PSA value 0.2 ng/mL or greater followed by a subsequent confirmatory ?PSA value 0.2 ng/mL or greater. ?Values obtained with different assay methods or kits cannot be used ?interchangeably. Results cannot be interpreted as absolute evidence ?of the presence or absence of malignant disease. ?  ?   ?  ?  Passed - Last BP in normal range  ?  BP Readings from Last 1 Encounters:  ?06/07/21 125/87  ?   ?  ?  Passed - Valid encounter within last 12 months  ?  Recent Outpatient Visits   ?      ? 4 months ago Essential hypertension  ? [facility] Jerrol Banana., MD  ? 9 months ago Annual physical exam  ? [facility] Jerrol Banana., MD  ? 2 years ago Annual physical exam  ? [facility] Jerrol Banana., MD  ? 3 years ago Annual physical exam  ? [facility] Jerrol Banana., MD  ? 3 years ago Obesity (BMI 35.0-39.9 without comorbidity)  ? [facility] Jerrol Banana., MD  ?  ?  ?Future Appointments   ?        ? In 1 month Ralene Bathe, MD [place]  ? In 1 month Jerrol Banana., MD [facility]  ?  ? ?  ?  ?  ? ? ?

## 2021-11-08 ENCOUNTER — Ambulatory Visit: Payer: BC Managed Care – PPO | Admitting: Family Medicine

## 2021-11-14 ENCOUNTER — Ambulatory Visit: Payer: BC Managed Care – PPO | Admitting: Dermatology

## 2021-11-14 DIAGNOSIS — L821 Other seborrheic keratosis: Secondary | ICD-10-CM

## 2021-11-14 DIAGNOSIS — Z1283 Encounter for screening for malignant neoplasm of skin: Secondary | ICD-10-CM | POA: Diagnosis not present

## 2021-11-14 DIAGNOSIS — Z86018 Personal history of other benign neoplasm: Secondary | ICD-10-CM

## 2021-11-14 DIAGNOSIS — D229 Melanocytic nevi, unspecified: Secondary | ICD-10-CM

## 2021-11-14 DIAGNOSIS — L570 Actinic keratosis: Secondary | ICD-10-CM

## 2021-11-14 DIAGNOSIS — L918 Other hypertrophic disorders of the skin: Secondary | ICD-10-CM

## 2021-11-14 DIAGNOSIS — L814 Other melanin hyperpigmentation: Secondary | ICD-10-CM

## 2021-11-14 DIAGNOSIS — Z85828 Personal history of other malignant neoplasm of skin: Secondary | ICD-10-CM | POA: Diagnosis not present

## 2021-11-14 DIAGNOSIS — D1800 Hemangioma unspecified site: Secondary | ICD-10-CM

## 2021-11-14 MED ORDER — FLUOROURACIL 5 % EX CREA: TOPICAL_CREAM | Freq: Two times a day (BID) | CUTANEOUS | 0 refills | Status: DC

## 2021-11-14 NOTE — Patient Instructions (Signed)

## 2021-11-14 NOTE — Progress Notes (Signed)
? ?Follow-Up Visit ?  ?Subjective  ?Wayne Goodwin is a 71 y.o. male who presents for the following: Actinic Keratosis (6 month follow up of face treated with LN2 x 16) and Follow-up (6 month biopsy follow up - right of midline sternum sup - BCC with mod-severe dysplastic nevus, left med popliteal - BCC - bithe biopsy and [facility] 05/10/2021). ?The patient presents for Upper Body Skin Exam (UBSE) for skin cancer screening and mole check.  The patient has spots, moles and lesions to be evaluated, some may be new or changing and the patient has concerns that these could be cancer. ?  ?The following portions of the chart were reviewed this encounter and updated as appropriate:  ? Tobacco  Allergies  Meds  Problems  Med Hx  Surg Hx  Fam Hx   ?  ?Review of Systems:  No other skin or systemic complaints except as noted in HPI or Assessment and Plan. ? ?Objective  ?Well appearing patient in no apparent distress; mood and affect are within normal limits. ? ?All skin waist up examined. ? ?Right midline sternum sup ?Well healed EDC site ? ?Left cheek ?Erythematous thin papules/macules with gritty scale.  ? ? ?Assessment & Plan  ? ?Acrochordons (Skin Tags) ?- Fleshy, skin-colored pedunculated papules ?- Benign appearing.  ?- Observe. ?- If desired, they can be removed with an in office procedure that is not covered by insurance. ?- Please call the clinic if you notice any new or changing lesions. ? ?Lentigines ?- Scattered tan macules ?- Due to sun exposure ?- Benign-appearing, observe ?- Recommend daily broad spectrum sunscreen SPF 30+ to sun-exposed areas, reapply every 2 hours as needed. ?- Call for any changes ? ?Seborrheic Keratoses ?- Stuck-on, waxy, tan-brown papules and/or plaques  ?- Benign-appearing ?- Discussed benign etiology and prognosis. ?- Observe ?- Call for any changes ? ?Melanocytic Nevi ?- Tan-brown and/or pink-flesh-colored symmetric macules and papules ?- Benign appearing on exam today ?- Observation ?-  Call clinic for new or changing moles ?- Recommend daily use of broad spectrum spf 30+ sunscreen to sun-exposed areas.  ? ?Hemangiomas ?- Red papules ?- Discussed benign nature ?- Observe ?- Call for any changes ? ?Skin cancer screening performed today. ? ?History of basal cell carcinoma (BCC) ?Right midline sternum sup ?BCC with moderate-severe dysplastic nevus ?Clear. Observe for recurrence. Call clinic for new or changing lesions.  Recommend regular skin exams, daily broad-spectrum spf 30+ sunscreen use, and photoprotection.    ? ?AK (actinic keratosis) ?Left cheek ?Actinic Damage - Severe, confluent actinic changes with pre-cancerous actinic keratoses  ?- Severe, chronic, not at goal, secondary to cumulative UV radiation exposure over time ?- diffuse scaly erythematous macules and papules with underlying dyspigmentation ?- Discussed Prescription "Field Treatment" for Severe, Chronic Confluent Actinic Changes with Pre-Cancerous Actinic Keratoses ?Field treatment involves treatment of an entire area of skin that has confluent Actinic Changes (Sun/ Ultraviolet light damage) and PreCancerous Actinic Keratoses by method of PhotoDynamic Therapy (PDT) and/or prescription Topical Chemotherapy agents such as 5-fluorouracil, 5-fluorouracil/calcipotriene, and/or imiquimod.  The purpose is to decrease the number of clinically evident and subclinical PreCancerous lesions to prevent progression to development of skin cancer by chemically destroying early precancer changes that may or may not be visible.  It has been shown to reduce the risk of developing skin cancer in the treated area. As a result of treatment, redness, scaling, crusting, and open sores may occur during treatment course. One or more than one of these methods  may be used and may have to be used several times to control, suppress and eliminate the PreCancerous changes. Discussed treatment course, expected reaction, and possible side effects. ?- Recommend daily  broad spectrum sunscreen SPF 30+ to sun-exposed areas, reapply every 2 hours as needed.  ?- Staying in the shade or wearing long sleeves, sun glasses (UVA+UVB protection) and wide brim hats (4-inch brim around the entire circumference of the hat) are also recommended. ?- Call for new or changing lesions. ? ?Start Fluorouracil 5%/Calcipotriene cream bid x 1 week - patient to start in one month ? ?fluorouracil (EFUDEX) 5 % cream - Left cheek ?Apply topically 2 (two) times daily. Apply to left cheek for 1 week ? ?Skin cancer screening ? ?Return in about 6 months (around 05/16/2022) for AK follow up. ? ?I, Ashok Cordia, CMA, am acting as scribe for Sarina Ser, MD . ?Documentation: I have reviewed the above documentation for accuracy and completeness, and I agree with the above. ? ?Sarina Ser, MD ? ?

## 2021-11-15 ENCOUNTER — Encounter: Payer: Self-pay | Admitting: Dermatology

## 2021-11-18 NOTE — Progress Notes (Signed)
?  ? ? ?Established patient visit ? ?I,April Miller,acting as a scribe for Wilhemena Durie, MD.,have documented all relevant documentation on the behalf of Wilhemena Durie, MD,as directed by  Wilhemena Durie, MD while in the presence of Wilhemena Durie, MD. ? ? ?Patient: Wayne Goodwin   DOB: March 26, 1951   71 y.o. Male  MRN: [id number] ?Visit Date: 11/21/2021 ? ?Today's healthcare provider: Wilhemena Durie, MD  ? ?Chief Complaint  ?Patient presents with  ? Follow-up  ? Hypertension  ? Hyperlipidemia  ? ?Subjective  ?  ?HPI  ?Patient continues to work.  He feels well and continues to exercise some.  He has bilateral knee pain. ?Takes his medications as prescribed.  He is working on trying to lose weight. ?Hypertension, follow-up ? ?BP Readings from Last 3 Encounters:  ?11/21/21 (!) 149/83  ?06/07/21 125/87  ?01/12/21 139/86  ? Wt Readings from Last 3 Encounters:  ?11/21/21 276 lb (125.2 kg)  ?06/07/21 282 lb (127.9 kg)  ?01/12/21 298 lb (135.2 kg)  ?  ? ?He was last seen for hypertension 6 months ago.  ?BP at that visit was 125/87. ?Management since that visit includes; Good control on Lotrel. ?He reports good compliance with treatment. ?He is not having side effects. none ?He is exercising. ?He is adherent to low salt diet.   ?Outside blood pressures are checks. ? ?He does not smoke. ? ?Use of agents associated with hypertension: none.  ? ?--------------------------------------------------------------------------------------------------- ?Lipid/Cholesterol, follow-up ? ?Last Lipid Panel: ?Lab Results  ?Component Value Date  ? CHOL 162 11/21/2021  ? [place] 81 11/21/2021  ? HDL 46 11/21/2021  ? TRIG 210 (H) 11/21/2021  ? ? ?He was last seen for this 10 months ago.  ?Management since that visit includes; labs checked. Patient advised to work on diet and exercise as discussed is mild fatty liver and high triglycerides are probably because of elevated liver function.  Would follow-up in a few months and  repeat some labs and see how patient is doing with weight. ? ?He reports good compliance with treatment. ?He is not having side effects. none ? ?He is following a Regular, Low Sodium diet. ?Current exercise: swimming ? ?Last metabolic panel ?Lab Results  ?Component Value Date  ? GLUCOSE 120 (H) 11/21/2021  ? NA 140 11/21/2021  ? K 4.5 11/21/2021  ? BUN 17 11/21/2021  ? CREATININE 1.07 11/21/2021  ? EGFR 75 11/21/2021  ? GFRNONAA 82 05/13/2020  ? CALCIUM 10.2 11/21/2021  ? AST 26 11/21/2021  ? ALT 25 11/21/2021  ? ?The 10-year ASCVD risk score (Arnett DK, et al., 2019) is: 24.9% ? ?--------------------------------------------------------------------------------------------------- ? ? ?Medications: ? ? ? ?Review of Systems  ?Constitutional:  Negative for appetite change, chills and fever.  ?Respiratory:  Negative for chest tightness, shortness of breath and wheezing.   ?Cardiovascular:  Negative for chest pain and palpitations.  ?Gastrointestinal:  Negative for abdominal pain, nausea and vomiting.  ? ?Last hemoglobin A1c ?Lab Results  ?Component Value Date  ? HGBA1C 6.3 (H) 11/21/2021  ? ?  ?  Objective  ?  ?BP (!) 149/83 (BP Location: Right Arm, Patient Position: Sitting, Cuff Size: Large)   Pulse 68   Temp 97.8 ?F (36.6 ?C) (Temporal)   Resp 16   Ht 6' (1.829 m)   Wt 276 lb (125.2 kg)   SpO2 98%   BMI 37.43 kg/m?  ?BP Readings from Last 3 Encounters:  ?11/21/21 (!) 149/83  ?06/07/21 125/87  ?01/12/21 139/86  ? ?  Wt Readings from Last 3 Encounters:  ?11/21/21 276 lb (125.2 kg)  ?06/07/21 282 lb (127.9 kg)  ?01/12/21 298 lb (135.2 kg)  ? ?  ? ?Physical Exam ?Vitals reviewed.  ?Constitutional:   ?   General: He is not in acute distress. ?   Appearance: He is well-developed.  ?HENT:  ?   Head: Normocephalic and atraumatic.  ?   Right Ear: Hearing normal.  ?   Left Ear: Hearing normal.  ?   Nose: Nose normal.  ?Eyes:  ?   General: Lids are normal. No scleral icterus.    ?   Right eye: No discharge.     ?   Left  eye: No discharge.  ?   Conjunctiva/sclera: Conjunctivae normal.  ?Cardiovascular:  ?   Rate and Rhythm: Normal rate and regular rhythm.  ?   Heart sounds: Normal heart sounds.  ?Pulmonary:  ?   Effort: Pulmonary effort is normal. No respiratory distress.  ?Skin: ?   Findings: No lesion or rash.  ?Neurological:  ?   General: No focal deficit present.  ?   Mental Status: He is alert and oriented to person, place, and time.  ?Psychiatric:     ?   Mood and Affect: Mood normal.     ?   Speech: Speech normal.     ?   Behavior: Behavior normal.     ?   Thought Content: Thought content normal.     ?   Judgment: Judgment normal.  ?  ? ? ?Results for orders placed or performed in visit on 11/21/21  ?Lipid panel  ?Result Value Ref Range  ? Cholesterol, Total 162 100 - 199 mg/dL  ? Triglycerides 210 (H) 0 - 149 mg/dL  ? HDL 46 >39 mg/dL  ? VLDL Cholesterol Cal 35 5 - 40 mg/dL  ? LDL Chol Calc (NIH) 81 0 - 99 mg/dL  ? Chol/HDL Ratio 3.5 0.0 - 5.0 ratio  ?TSH  ?Result Value Ref Range  ? TSH 2.120 0.450 - 4.500 uIU/mL  ?CBC w/Diff/Platelet  ?Result Value Ref Range  ? WBC 7.3 3.4 - 10.8 x10E3/uL  ? RBC 4.41 4.14 - 5.80 x10E6/uL  ? Hemoglobin 14.4 13.0 - 17.7 g/dL  ? Hematocrit 42.6 37.5 - 51.0 %  ? MCV 97 79 - 97 fL  ? MCH 32.7 26.6 - 33.0 pg  ? MCHC 33.8 31.5 - 35.7 g/dL  ? RDW 12.6 11.6 - 15.4 %  ? Platelets 159 150 - 450 x10E3/uL  ? Neutrophils 65 Not Estab. %  ? Lymphs 27 Not Estab. %  ? Monocytes 7 Not Estab. %  ? Eos 1 Not Estab. %  ? Basos 0 Not Estab. %  ? Neutrophils Absolute 4.7 1.4 - 7.0 x10E3/uL  ? Lymphocytes Absolute 2.0 0.7 - 3.1 x10E3/uL  ? Monocytes Absolute 0.5 0.1 - 0.9 x10E3/uL  ? EOS (ABSOLUTE) 0.1 0.0 - 0.4 x10E3/uL  ? Basophils Absolute 0.0 0.0 - 0.2 x10E3/uL  ? Immature Granulocytes 0 Not Estab. %  ? Immature Grans (Abs) 0.0 0.0 - 0.1 x10E3/uL  ?Comprehensive Metabolic Panel (CMET)  ?Result Value Ref Range  ? Glucose 120 (H) 70 - 99 mg/dL  ? BUN 17 8 - 27 mg/dL  ? Creatinine, Ser 1.07 0.76 - 1.27 mg/dL  ?  eGFR 75 >59 mL/min/1.73  ? BUN/Creatinine Ratio 16 10 - 24  ? Sodium 140 134 - 144 mmol/L  ? Potassium 4.5 3.5 - 5.2 mmol/L  ? Chloride 102  96 - 106 mmol/L  ? CO2 22 20 - 29 mmol/L  ? Calcium 10.2 8.6 - 10.2 mg/dL  ? Total Protein 7.1 6.0 - 8.5 g/dL  ? Albumin 5.0 (H) 3.8 - 4.8 g/dL  ? Globulin, Total 2.1 1.5 - 4.5 g/dL  ? Albumin/Globulin Ratio 2.4 (H) 1.2 - 2.2  ? Bilirubin Total 0.6 0.0 - 1.2 mg/dL  ? Alkaline Phosphatase 77 44 - 121 IU/L  ? AST 26 0 - 40 IU/L  ? ALT 25 0 - 44 IU/L  ?Hemoglobin A1c  ?Result Value Ref Range  ? Hgb A1c MFr Bld 6.3 (H) 4.8 - 5.6 %  ? Est. average glucose Bld gHb Est-mCnc 134 mg/dL  ? ? Assessment & Plan  ? ?1. Essential hypertension ?Home blood pressure readings.  Patient on Lotrel maximum dose ?- Lipid panel ?- TSH ?- CBC w/Diff/Platelet ?- Comprehensive Metabolic Panel (CMET) ?- Hemoglobin A1c ? ?2. Hypercholesteremia ?On Crestor 10 ?- Lipid panel ?- TSH ?- CBC w/Diff/Platelet ?- Comprehensive Metabolic Panel (CMET) ?- Hemoglobin A1c ? ?3. Hyperglycemia ?Had an exercise stress ?- Lipid panel ?- TSH ?- CBC w/Diff/Platelet ?- Comprehensive Metabolic Panel (CMET) ?- Hemoglobin A1c ? ?4. Class 2 severe obesity due to excess calories with serious comorbidity and body mass index (BMI) of 38.0 to 38.9 in adult [facility]) ?Patient working on weight loss. ?Phentermine has helped in the past. ?5.idiopathic gout of right hand ? ? ?6. H/O prostate cancer ?Last PSA was 1.08 January 2021 ? ? ?No follow-ups on file.  ?   ? ?I, Wilhemena Durie, MD, have reviewed all documentation for this visit. The documentation on 11/26/21 for the exam, diagnosis, procedures, and orders are all accurate and complete. ? ? ? ?Tyrisha Benninger Cranford Mon, MD  ?[facility] ?[phone] (phone) ?[phone] (fax) ? ?[facility] ?

## 2021-11-21 ENCOUNTER — Ambulatory Visit: Payer: BC Managed Care – PPO | Admitting: Family Medicine

## 2021-11-21 ENCOUNTER — Encounter: Payer: Self-pay | Admitting: Family Medicine

## 2021-11-21 VITALS — BP 149/83 | HR 68 | Temp 97.8°F | Resp 16 | Ht 72.0 in | Wt 276.0 lb

## 2021-11-21 DIAGNOSIS — I10 Essential (primary) hypertension: Secondary | ICD-10-CM

## 2021-11-21 DIAGNOSIS — E7800 Pure hypercholesterolemia, unspecified: Secondary | ICD-10-CM

## 2021-11-21 DIAGNOSIS — R739 Hyperglycemia, unspecified: Secondary | ICD-10-CM

## 2021-11-21 DIAGNOSIS — Z8546 Personal history of malignant neoplasm of prostate: Secondary | ICD-10-CM

## 2021-11-21 DIAGNOSIS — Z6838 Body mass index (BMI) 38.0-38.9, adult: Secondary | ICD-10-CM

## 2021-11-21 DIAGNOSIS — M10041 Idiopathic gout, right hand: Secondary | ICD-10-CM

## 2021-11-22 LAB — LIPID PANEL
Chol/HDL Ratio: 3.5 ratio (ref 0.0–5.0)
Cholesterol, Total: 162 mg/dL (ref 100–199)
HDL: 46 mg/dL (ref >39)
LDL Chol Calc (NIH): 81 mg/dL (ref 0–99)
Triglycerides: 210 mg/dL — ABNORMAL HIGH (ref 0–149)
VLDL Cholesterol Cal: 35 mg/dL (ref 5–40)

## 2021-11-22 LAB — COMPREHENSIVE METABOLIC PANEL
ALT: 25 IU/L (ref 0–44)
AST: 26 IU/L (ref 0–40)
Albumin/Globulin Ratio: 2.4 — ABNORMAL HIGH (ref 1.2–2.2)
Albumin: 5 g/dL — ABNORMAL HIGH (ref 3.8–4.8)
Alkaline Phosphatase: 77 IU/L (ref 44–121)
BUN/Creatinine Ratio: 16 (ref 10–24)
BUN: 17 mg/dL (ref 8–27)
Bilirubin Total: 0.6 mg/dL (ref 0.0–1.2)
CO2: 22 mmol/L (ref 20–29)
Calcium: 10.2 mg/dL (ref 8.6–10.2)
Chloride: 102 mmol/L (ref 96–106)
Creatinine, Ser: 1.07 mg/dL (ref 0.76–1.27)
Globulin, Total: 2.1 g/dL (ref 1.5–4.5)
Glucose: 120 mg/dL — ABNORMAL HIGH (ref 70–99)
Potassium: 4.5 mmol/L (ref 3.5–5.2)
Sodium: 140 mmol/L (ref 134–144)
Total Protein: 7.1 g/dL (ref 6.0–8.5)
eGFR: 75 mL/min/{1.73_m2} (ref >59)

## 2021-11-22 LAB — CBC WITH DIFFERENTIAL/PLATELET
Basophils Absolute: 0 10*3/uL (ref 0.0–0.2)
Basos: 0 %
EOS (ABSOLUTE): 0.1 10*3/uL (ref 0.0–0.4)
Eos: 1 %
Hematocrit: 42.6 % (ref 37.5–51.0)
Hemoglobin: 14.4 g/dL (ref 13.0–17.7)
Immature Grans (Abs): 0 10*3/uL (ref 0.0–0.1)
Immature Granulocytes: 0 %
Lymphocytes Absolute: 2 10*3/uL (ref 0.7–3.1)
Lymphs: 27 %
MCH: 32.7 pg (ref 26.6–33.0)
MCHC: 33.8 g/dL (ref 31.5–35.7)
MCV: 97 fL (ref 79–97)
Monocytes Absolute: 0.5 10*3/uL (ref 0.1–0.9)
Monocytes: 7 %
Neutrophils Absolute: 4.7 10*3/uL (ref 1.4–7.0)
Neutrophils: 65 %
Platelets: 159 10*3/uL (ref 150–450)
RBC: 4.41 x10E6/uL (ref 4.14–5.80)
RDW: 12.6 % (ref 11.6–15.4)
WBC: 7.3 10*3/uL (ref 3.4–10.8)

## 2021-11-22 LAB — HEMOGLOBIN A1C
Est. average glucose Bld gHb Est-mCnc: 134 mg/dL
Hgb A1c MFr Bld: 6.3 % — ABNORMAL HIGH (ref 4.8–5.6)

## 2021-11-22 LAB — TSH: TSH: 2.12 u[IU]/mL (ref 0.450–4.500)

## 2021-11-23 ENCOUNTER — Other Ambulatory Visit: Payer: Self-pay | Admitting: Family Medicine

## 2021-11-23 NOTE — Telephone Encounter (Signed)
Requested medication (s) are due for refill today: yes ? ?Requested medication (s) are on the active medication list: yes ? ?Last refill:  10/11/21 ? ?Future visit scheduled: no ? ?Notes to clinic:  Unable to refill per protocol, cannot delegate. ? ? ? ?  ?Requested Prescriptions  ?Pending Prescriptions Disp Refills  ? phentermine (ADIPEX-P) 37.5 MG tablet [Pharmacy Med Name: PHENTERMINE HCL 37.5 MG TAB] 30 tablet   ?  Sig: TAKE 1 TABLET BY MOUTH DAILY FOR APPETITE SUPPRESSION  ?  ? Not Delegated - Neurology: Anticonvulsants - Controlled - phentermine hydrochloride Failed - 11/23/2021 11:08 AM  ?  ?  Failed - This refill cannot be delegated  ?  ?  Failed - Last BP in normal range  ?  BP Readings from Last 1 Encounters:  ?11/21/21 (!) 149/83  ?  ?  ?  ?  Passed - eGFR in normal range and within 360 days  ?  GFR calc Af Amer  ?Date Value Ref Range Status  ?05/13/2020 95 >59 mL/min/1.73 Final  ?  Comment:  ?  **[facility] currently reports eGFR in compliance with the current** ?  recommendations of the [employer]. [facility] will ?  update reporting as new guidelines are published from the NKF-ASN ?  Task force. ?  ? ?GFR calc non Af Amer  ?Date Value Ref Range Status  ?05/13/2020 82 >59 mL/min/1.73 Final  ? ?eGFR  ?Date Value Ref Range Status  ?11/21/2021 75 >59 mL/min/1.73 Final  ?  ?  ?  ?  Passed - Cr in normal range and within 360 days  ?  Creat  ?Date Value Ref Range Status  ?05/01/2017 0.89 0.70 - 1.25 mg/dL Final  ?  Comment:  ?  For patients >25 years of age, the reference limit ?for Creatinine is approximately 13% higher for people ?identified as African-American. ?. ?  ? ?Creatinine, Ser  ?Date Value Ref Range Status  ?11/21/2021 1.07 0.76 - 1.27 mg/dL Final  ?  ?  ?  ?  Passed - Valid encounter within last 6 months  ?  Recent Outpatient Visits   ? ?      ? 2 days ago Essential hypertension  ? [facility] Jerrol Banana., [facility]  ? 5 months ago Essential hypertension  ? [facility] Jerrol Banana., MD  ? 10 months ago Annual physical exam  ? [facility] Jerrol Banana., MD  ? 2 years ago Annual physical exam  ? [facility] Jerrol Banana., MD  ? 3 years ago Annual physical exam  ? [facility] Jerrol Banana., MD  ? ?  ?  ?Future Appointments   ? ?        ? In 6 months Ralene Bathe, MD [place]  ? ?  ? ? ?  ?  ?  Passed - Weight completed in the last 3 months  ?  Wt Readings from Last 1 Encounters:  ?11/21/21 276 lb (125.2 kg)  ? ?  ?  ?  ? ? ?

## 2021-12-06 ENCOUNTER — Other Ambulatory Visit: Payer: Self-pay | Admitting: Family Medicine

## 2021-12-06 DIAGNOSIS — G4700 Insomnia, unspecified: Secondary | ICD-10-CM

## 2021-12-06 NOTE — Telephone Encounter (Signed)
Requested medication (s) are due for refill today - yes ? ?Requested medication (s) are on the active medication list -yes ? ?Future visit scheduled -no ? ?Last refill: 06/07/21 #30 5RF ? ?Notes to clinic: Request RF: non delegated Rx ? ?Requested Prescriptions  ?Pending Prescriptions Disp Refills  ? zolpidem (AMBIEN) 5 MG tablet [Pharmacy Med Name: ZOLPIDEM TARTRATE 5 MG TAB] 30 tablet   ?  Sig: TAKE 1 TABLET BY MOUTH AT BEDTIME  ?  ? Not Delegated - Psychiatry:  Anxiolytics/Hypnotics Failed - 12/06/2021  9:28 AM  ?  ?  Failed - This refill cannot be delegated  ?  ?  Failed - Urine Drug Screen completed in last 360 days  ?  ?  Passed - Valid encounter within last 6 months  ?  Recent Outpatient Visits   ? ?      ? 2 weeks ago Essential hypertension  ? [facility] Jerrol Banana., [facility]  ? 6 months ago Essential hypertension  ? [facility] Jerrol Banana., [facility]  ? 10 months ago Annual physical exam  ? [facility] Jerrol Banana., MD  ? 2 years ago Annual physical exam  ? [facility] Jerrol Banana., MD  ? 3 years ago Annual physical exam  ? [facility] Jerrol Banana., [facility]  ? ?  ?  ?Future Appointments   ? ?        ? In 5 months Ralene Bathe, MD [place]  ? ?  ? ? ?  ?  ?  ? ? ? ?Requested Prescriptions  ?Pending Prescriptions Disp Refills  ? zolpidem (AMBIEN) 5 MG tablet [Pharmacy Med Name: ZOLPIDEM TARTRATE 5 MG TAB] 30 tablet   ?  Sig: TAKE 1 TABLET BY MOUTH AT BEDTIME  ?  ? Not Delegated - Psychiatry:  Anxiolytics/Hypnotics Failed - 12/06/2021  9:28 AM  ?  ?  Failed - This refill cannot be delegated  ?  ?  Failed - Urine Drug Screen completed in last 360 days  ?  ?  Passed - Valid encounter within last 6 months  ?  Recent Outpatient Visits   ? ?      ? 2 weeks ago Essential hypertension  ? [facility] Jerrol Banana., MD  ? 6 months ago Essential hypertension  ? [facility] Jerrol Banana., MD  ? 10 months ago Annual physical exam  ? [facility] Jerrol Banana., MD  ? 2 years ago Annual physical exam  ? [facility] Jerrol Banana., MD  ? 3 years ago Annual physical exam  ? [facility] Jerrol Banana., MD  ? ?  ?  ?Future Appointments   ? ?        ? In 5 months Ralene Bathe, MD [place]  ? ?  ? ? ?  ?  ?  ? ? ? ?

## 2021-12-08 ENCOUNTER — Encounter: Payer: BC Managed Care – PPO | Admitting: Family Medicine

## 2021-12-12 ENCOUNTER — Other Ambulatory Visit: Payer: Self-pay | Admitting: Family Medicine

## 2021-12-12 DIAGNOSIS — I10 Essential (primary) hypertension: Secondary | ICD-10-CM

## 2022-02-20 ENCOUNTER — Telehealth: Payer: Self-pay | Admitting: Family Medicine

## 2022-02-20 DIAGNOSIS — M170 Bilateral primary osteoarthritis of knee: Secondary | ICD-10-CM | POA: Diagnosis not present

## 2022-02-20 NOTE — Telephone Encounter (Signed)
It very well might help.  We could do a referral to [facility] but the easiest thing for the patient to do will probably go to the walk-in [facility] clinic and see with they will be able to accommodate his wishes

## 2022-02-20 NOTE — Telephone Encounter (Signed)
Patient was advised.  

## 2022-02-20 NOTE — Telephone Encounter (Signed)
Pt stated he is traveling to [place] and has bad arthritis in his knees. Someone suggested a cortisone shot might be good. Will be walking more than normal.   Pt wants to ask Dr.Gillbert his opinion on this, and if he agrees, he would like to come in to get shot before his trip.  Pt stated he leaves on Tuesday, July 25th.

## 2022-02-22 ENCOUNTER — Other Ambulatory Visit: Payer: Self-pay | Admitting: Family Medicine

## 2022-02-22 DIAGNOSIS — G4700 Insomnia, unspecified: Secondary | ICD-10-CM

## 2022-02-22 DIAGNOSIS — E7800 Pure hypercholesterolemia, unspecified: Secondary | ICD-10-CM

## 2022-02-22 DIAGNOSIS — I10 Essential (primary) hypertension: Secondary | ICD-10-CM

## 2022-02-22 NOTE — Telephone Encounter (Signed)
Medication Refill - Medication: zolpidem (AMBIEN) 5 MG tablet,rosuvastatin (CRESTOR) 10 MG tablet,amLODipine-benazepril (LOTREL) 10-40 MG capsule  Pt is requesting medication refills Pt stated will be traveling out of the country and will run out of his medication if he doesn't get anything extra.   Has the patient contacted their pharmacy? No. No, more refills   (Agent: If no, request that the patient contact the pharmacy for the refill. If patient does not wish to contact the pharmacy document the reason why and proceed with request.)   Preferred Pharmacy (with phone number or street name):  [place], [place] - [address]  [place] [address]  Phone: [phone] Fax: [phone]  Hours: Not open 24 hours   Has the patient been seen for an appointment in the last year OR does the patient have an upcoming appointment? Yes.    Agent: Please be advised that RX refills may take up to 3 business days. We ask that you follow-up with your pharmacy.

## 2022-02-23 MED ORDER — AMLODIPINE BESY-BENAZEPRIL HCL 10-40 MG PO CAPS: ORAL_CAPSULE | ORAL | 0 refills | Status: DC

## 2022-02-23 MED ORDER — ROSUVASTATIN CALCIUM 10 MG PO TABS: 10.0000 mg | ORAL_TABLET | Freq: Every day | ORAL | 2 refills | Status: DC

## 2022-02-23 NOTE — Telephone Encounter (Signed)
Requested Prescriptions  Pending Prescriptions Disp Refills  . zolpidem (AMBIEN) 5 MG tablet 30 tablet 5    Sig: Take 1 tablet (5 mg total) by mouth at bedtime.     Not Delegated - Psychiatry:  Anxiolytics/Hypnotics Failed - 02/22/2022  3:44 PM      Failed - This refill cannot be delegated      Failed - Urine Drug Screen completed in last 360 days      Passed - Valid encounter within last 6 months    Recent Outpatient Visits          3 months ago Essential hypertension   [facility] Jerrol Banana., [facility]   8 months ago Essential hypertension   [facility] Jerrol Banana., MD   1 year ago Annual physical exam   [facility] Jerrol Banana., [facility]   2 years ago Annual physical exam   [facility] Jerrol Banana., [facility]   3 years ago Annual physical exam   [facility] Jerrol Banana., MD      Future Appointments            In 2 months Ralene Bathe, MD [place]           . amLODipine-benazepril (LOTREL) 10-40 MG capsule 90 capsule 0     Cardiovascular: CCB + ACEI Combos Failed - 02/22/2022  3:44 PM      Failed - Last BP in normal range    BP Readings from Last 1 Encounters:  11/21/21 (!) 149/83         Passed - Cr in normal range and within 180 days    Creat  Date Value Ref Range Status  05/01/2017 0.89 0.70 - 1.25 mg/dL Final    Comment:    For patients >68 years of age, the reference limit for Creatinine is approximately 13% higher for people identified as African-American. .    Creatinine, Ser  Date Value Ref Range Status  11/21/2021 1.07 0.76 - 1.27 mg/dL Final         Passed - K in normal range and within 180 days    Potassium  Date Value Ref Range Status  11/21/2021 4.5 3.5 - 5.2 mmol/L Final         Passed - Na in normal range and within 180 days    Sodium  Date Value Ref Range Status  11/21/2021 140 134 - 144 mmol/L Final          Passed - eGFR is 30 or above and within 180 days    GFR calc Af Amer  Date Value Ref Range Status  05/13/2020 95 >59 mL/min/1.73 Final    Comment:    **[facility] currently reports eGFR in compliance with the current**   recommendations of the [employer]. [facility] will   update reporting as new guidelines are published from the NKF-ASN   Task force.    GFR calc non Af Amer  Date Value Ref Range Status  05/13/2020 82 >59 mL/min/1.73 Final   eGFR  Date Value Ref Range Status  11/21/2021 75 >59 mL/min/1.73 Final         Passed - Patient is not pregnant      Passed - Valid encounter within last 6 months    Recent Outpatient Visits          3 months ago Essential hypertension   [facility] Miguel Aschoff  Kaylyn Lim., MD   8 months ago Essential hypertension   [facility] Jerrol Banana., MD   1 year ago Annual physical exam   [facility] Jerrol Banana., MD   2 years ago Annual physical exam   [facility] Jerrol Banana., [facility]   3 years ago Annual physical exam   [facility] Jerrol Banana., MD      Future Appointments            In 2 months Ralene Bathe, MD [place]           . rosuvastatin (CRESTOR) 10 MG tablet 30 tablet 5    Sig: Take 1 tablet (10 mg total) by mouth daily.     Cardiovascular:  Antilipid - Statins 2 Failed - 02/22/2022  3:44 PM      Failed - Lipid Panel in normal range within the last 12 months    Cholesterol, Total  Date Value Ref Range Status  11/21/2021 162 100 - 199 mg/dL Final   LDL Cholesterol (Calc)  Date Value Ref Range Status  05/01/2017 63 mg/dL (calc) Final    Comment:    Reference range: <100 . Desirable range <100 mg/dL for primary prevention;   <70 mg/dL for patients with CHD or diabetic patients  with > or = 2 CHD risk factors. Marland Kitchen LDL-C is now calculated using the Martin-Hopkins  calculation, which  is a validated novel method providing  better accuracy than the Friedewald equation in the  estimation of LDL-C.  Cresenciano Genre et al. Annamaria Helling. 1962;229(79): 2061-2068  (http://education.[employer].com/faq/FAQ164)    LDL Chol Calc (NIH)  Date Value Ref Range Status  11/21/2021 81 0 - 99 mg/dL Final   HDL  Date Value Ref Range Status  11/21/2021 46 >39 mg/dL Final   Triglycerides  Date Value Ref Range Status  11/21/2021 210 (H) 0 - 149 mg/dL Final         Passed - Cr in normal range and within 360 days    Creat  Date Value Ref Range Status  05/01/2017 0.89 0.70 - 1.25 mg/dL Final    Comment:    For patients >81 years of age, the reference limit for Creatinine is approximately 13% higher for people identified as African-American. .    Creatinine, Ser  Date Value Ref Range Status  11/21/2021 1.07 0.76 - 1.27 mg/dL Final         Passed - Patient is not pregnant      Passed - Valid encounter within last 12 months    Recent Outpatient Visits          3 months ago Essential hypertension   [facility] Jerrol Banana., MD   8 months ago Essential hypertension   [facility] Jerrol Banana., MD   1 year ago Annual physical exam   [facility] Jerrol Banana., MD   2 years ago Annual physical exam   [facility] Jerrol Banana., MD   3 years ago Annual physical exam   [facility] Jerrol Banana., MD      Future Appointments            In 2 months Ralene Bathe, MD [place]

## 2022-02-23 NOTE — Telephone Encounter (Signed)
Requested medications are due for refill today.  No - too soon  Requested medications are on the active medications list.  yes  Last refill. 12/09/2021 #30 5 refills  Future visit scheduled.   no  Notes to clinic.  [place] the [place] and is requesting refill. Refill not delegated.    Requested Prescriptions  Pending Prescriptions Disp Refills   zolpidem (AMBIEN) 5 MG tablet 30 tablet 5    Sig: Take 1 tablet (5 mg total) by mouth at bedtime.     Not Delegated - Psychiatry:  Anxiolytics/Hypnotics Failed - 02/22/2022  3:44 PM      Failed - This refill cannot be delegated      Failed - Urine Drug Screen completed in last 360 days      Passed - Valid encounter within last 6 months    Recent Outpatient Visits           3 months ago Essential hypertension   [facility] Jerrol [facility]., [facility]   8 months ago Essential hypertension   [facility] Jerrol Banana., MD   1 year ago Annual physical exam   [facility] Jerrol Banana., MD   2 years ago Annual physical exam   [facility] Jerrol Banana., MD   3 years ago Annual physical exam   [facility] Jerrol Banana., MD       Future Appointments             In 2 months Ralene Bathe, MD [place]            Signed Prescriptions Disp Refills   amLODipine-benazepril (LOTREL) 10-40 MG capsule 90 capsule 0    Sig: TAKE 1 CAPSULE BY MOUTH ONCE DAILY     Cardiovascular: CCB + ACEI Combos Failed - 02/22/2022  3:44 PM      Failed - Last BP in normal range    BP Readings from Last 1 Encounters:  11/21/21 (!) 149/83         Passed - Cr in normal range and within 180 days    Creat  Date Value Ref Range Status  05/01/2017 0.89 0.70 - 1.25 mg/dL Final    Comment:    For patients >71 years of age, the reference limit for Creatinine is approximately 13% higher for people identified as  African-American. .    Creatinine, Ser  Date Value Ref Range Status  11/21/2021 1.07 0.76 - 1.27 mg/dL Final         Passed - K in normal range and within 180 days    Potassium  Date Value Ref Range Status  11/21/2021 4.5 3.5 - 5.2 mmol/L Final         Passed - Na in normal range and within 180 days    Sodium  Date Value Ref Range Status  11/21/2021 140 134 - 144 mmol/L Final         Passed - eGFR is 30 or above and within 180 days    GFR calc Af Amer  Date Value Ref Range Status  05/13/2020 95 >59 mL/min/1.73 Final    Comment:    **[facility] currently reports eGFR in compliance with the current**   recommendations of the [employer]. [facility] will   update reporting as new guidelines are published from the NKF-ASN   Task force.    GFR calc non Af Wyvonnia Lora  Date  Value Ref Range Status  05/13/2020 82 >59 mL/min/1.73 Final   eGFR  Date Value Ref Range Status  11/21/2021 75 >59 mL/min/1.73 Final         Passed - Patient is not pregnant      Passed - Valid encounter within last 6 months    Recent Outpatient Visits           3 months ago Essential hypertension   [facility] Jer[facility] Banana., MD   8 months ago Essential hypertension   [facility] Jerrol Banana., MD   1 year ago Annual physical exam   [facility] Jerrol Banana., MD   2 years ago Annual physical exam   [facility] Jerrol Banana., MD   3 years ago Annual physical exam   [facility] Jerrol Banana., MD       Future Appointments             In 2 months Ralene Bathe, MD [place]             rosuvastatin (CRESTOR) 10 MG tablet 90 tablet 2    Sig: Take 1 tablet (10 mg total) by mouth daily.     Cardiovascular:  Antilipid - Statins 2 Failed - 02/22/2022  3:44 PM      Failed - Lipid Panel in normal range within the last 12 months    Cholesterol, Total   Date Value Ref Range Status  11/21/2021 162 100 - 199 mg/dL Final   LDL Cholesterol (Calc)  Date Value Ref Range Status  05/01/2017 63 mg/dL (calc) Final    Comment:    Reference range: <100 . Desirable range <100 mg/dL for primary prevention;   <70 mg/dL for patients with CHD or diabetic patients  with > or = 2 CHD risk factors. Marland Kitchen LDL-C is now calculated using the Martin-Hopkins  calculation, which is a validated novel method providing  better accuracy than the Friedewald equation in the  estimation of LDL-C.  Cresenciano Genre et al. Annamaria Helling. 7972;820(60): 2061-2068  (http://education.[facility].com/faq/FAQ164)    LDL Chol Calc (NIH)  Date Value Ref Range Status  11/21/2021 81 0 - 99 mg/dL Final   HDL  Date Value Ref Range Status  11/21/2021 46 >39 mg/dL Final   Triglycerides  Date Value Ref Range Status  11/21/2021 210 (H) 0 - 149 mg/dL Final         Passed - Cr in normal range and within 360 days    Creat  Date Value Ref Range Status  05/01/2017 0.89 0.70 - 1.25 mg/dL Final    Comment:    For patients >37 years of age, the reference limit for Creatinine is approximately 13% higher for people identified as African-American. .    Creatinine, Ser  Date Value Ref Range Status  11/21/2021 1.07 0.76 - 1.27 mg/dL Final         Passed - Patient is not pregnant      Passed - Valid encounter within last 12 months    Recent Outpatient Visits           3 months ago Essential hypertension   [facility] Jerrol Banana., MD   8 months ago Essential hypertension   [facility] Jerrol Banana., MD   1 year ago Annual physical exam   [facility] Jerrol Banana., MD   2 years ago  Annual physical exam   [facility] Jerrol Banana., MD   3 years ago Annual physical exam   [facility] Jerrol Banana., MD       Future Appointments             In 2 months  Ralene Bathe, MD [place]             b

## 2022-02-24 MED ORDER — ZOLPIDEM TARTRATE 5 MG PO TABS: 5.0000 mg | ORAL_TABLET | Freq: Every evening | ORAL | 5 refills | Status: DC | PRN

## 2022-03-10 ENCOUNTER — Other Ambulatory Visit: Payer: Self-pay | Admitting: Family Medicine

## 2022-03-10 DIAGNOSIS — I10 Essential (primary) hypertension: Secondary | ICD-10-CM

## 2022-03-10 NOTE — Telephone Encounter (Signed)
Refilled 02/23/2022 #90 0 rf - confirmed by same pharmacy. Requested Prescriptions  Refused Prescriptions Disp Refills  . amLODipine-benazepril (LOTREL) 10-40 MG capsule [Pharmacy Med Name: AMLODIPINE-BENAZEPRIL 10-40 MG CAP] 90 capsule 0    Sig: TAKE 1 CAPSULE BY MOUTH ONCE DAILY NEED TO SCHEDULE APPT     Cardiovascular: CCB + ACEI Combos Failed - 03/10/2022  2:40 PM      Failed - Last BP in normal range    BP Readings from Last 1 Encounters:  11/21/21 (!) 149/83         Passed - Cr in normal range and within 180 days    Creat  Date Value Ref Range Status  05/01/2017 0.89 0.70 - 1.25 mg/dL Final    Comment:    For patients >108 years of age, the reference limit for Creatinine is approximately 13% higher for people identified as African-American. .    Creatinine, Ser  Date Value Ref Range Status  11/21/2021 1.07 0.76 - 1.27 mg/dL Final         Passed - K in normal range and within 180 days    Potassium  Date Value Ref Range Status  11/21/2021 4.5 3.5 - 5.2 mmol/L Final         Passed - Na in normal range and within 180 days    Sodium  Date Value Ref Range Status  11/21/2021 140 134 - 144 mmol/L Final         Passed - eGFR is 30 or above and within 180 days    GFR calc Af Amer  Date Value Ref Range Status  05/13/2020 95 >59 mL/min/1.73 Final    Comment:    **[facility] currently reports eGFR in compliance with the current**   recommendations of the [employer]. [facility] will   update reporting as new guidelines are published from the NKF-ASN   Task force.    GFR calc non Af Amer  Date Value Ref Range Status  05/13/2020 82 >59 mL/min/1.73 Final   eGFR  Date Value Ref Range Status  11/21/2021 75 >59 mL/min/1.73 Final         Passed - Patient is not pregnant      Passed - Valid encounter within last 6 months    Recent Outpatient Visits          3 months ago Essential hypertension   [facility] Jerrol Banana., MD   9 months  ago Essential hypertension   [facility] Jerrol Banana., MD   1 year ago Annual physical exam   [facility] Jerrol Banana., MD   2 years ago Annual physical exam   [facility] Jerrol Banana., MD   3 years ago Annual physical exam   [facility] Jerrol Banana., MD      Future Appointments            In 2 months Ralene Bathe, MD [place]

## 2022-03-16 ENCOUNTER — Other Ambulatory Visit: Payer: Self-pay | Admitting: Family Medicine

## 2022-03-16 DIAGNOSIS — I10 Essential (primary) hypertension: Secondary | ICD-10-CM

## 2022-03-16 NOTE — Telephone Encounter (Signed)
Sherri from [place] stated was sending a refill request for next time Refusing this request.  Last RF 02/23/22 #90 - sent message to pt via MyChart to call office and make appt.  Requested Prescriptions  Pending Prescriptions Disp Refills   amLODipine-benazepril (LOTREL) 10-40 MG capsule [Pharmacy Med Name: AMLODIPINE-BENAZEPRIL 10-40 MG CAP] 90 capsule 0    Sig: TAKE 1 CAPSULE BY MOUTH ONCE DAILY **NEED TO SCHEDULE APPT     Cardiovascular: CCB + ACEI Combos Failed - 03/16/2022  9:56 AM      Failed - Last BP in normal range    BP Readings from Last 1 Encounters:  11/21/21 (!) 149/83         Passed - Cr in normal range and within 180 days    Creat  Date Value Ref Range Status  05/01/2017 0.89 0.70 - 1.25 mg/dL Final    Comment:    For patients >35 years of age, the reference limit for Creatinine is approximately 13% higher for people identified as African-American. .    Creatinine, Ser  Date Value Ref Range Status  11/21/2021 1.07 0.76 - 1.27 mg/dL Final         Passed - K in normal range and within 180 days    Potassium  Date Value Ref Range Status  11/21/2021 4.5 3.5 - 5.2 mmol/L Final         Passed - Na in normal range and within 180 days    Sodium  Date Value Ref Range Status  11/21/2021 140 134 - 144 mmol/L Final         Passed - eGFR is 30 or above and within 180 days    GFR calc Af Amer  Date Value Ref Range Status  05/13/2020 95 >59 mL/min/1.73 Final    Comment:    **[facility] currently reports eGFR in compliance with the current**   recommendations of the [employer]. [facility] will   update reporting as new guidelines are published from the NKF-ASN   Task force.    GFR calc non Af Amer  Date Value Ref Range Status  05/13/2020 82 >59 mL/min/1.73 Final   eGFR  Date Value Ref Range Status  11/21/2021 75 >59 mL/min/1.73 Final         Passed - Patient is not pregnant      Passed - Valid encounter within last 6 months    Recent  Outpatient Visits           3 months ago Essential hypertension   [facility] Jerrol Banana., MD   9 months ago Essential hypertension   [facility] Jerrol Banana., [facility]   1 year ago Annual physical exam   [facility] Jerrol Banana., MD   2 years ago Annual physical exam   [facility] Jerrol Banana., MD   3 years ago Annual physical exam   [facility] Jerrol Banana., MD       Future Appointments             In 2 months Ralene Bathe, MD [place]

## 2022-03-28 ENCOUNTER — Telehealth: Payer: Self-pay | Admitting: Family Medicine

## 2022-03-28 DIAGNOSIS — I10 Essential (primary) hypertension: Secondary | ICD-10-CM

## 2022-03-30 ENCOUNTER — Telehealth: Payer: Self-pay | Admitting: Family Medicine

## 2022-03-30 NOTE — Telephone Encounter (Signed)
Medication Refill - Medication: amLODipine-benazepril (LOTREL) 10-40 Refused, says patient requesting too early, but he only has 1 pill left  Has the patient contacted their pharmacy? yes (Agent: If no, request that the patient contact the pharmacy for the refill. If patient does not wish to contact the pharmacy document the reason why and proceed with request.) (Agent: If yes, when and what did the pharmacy advise?)contact pcp  Preferred Pharmacy (with phone number or street name):  [place], [place] - [place] Phone:  [phone]  Fax:  [phone]     Has the patient been seen for an appointment in the last year OR does the patient have an upcoming appointment? yes  Agent: Please be advised that RX refills may take up to 3 business days. We ask that you follow-up with your pharmacy.

## 2022-03-30 NOTE — Telephone Encounter (Signed)
Pt is calling to check on the status of medication refill. Please advise

## 2022-03-30 NOTE — Telephone Encounter (Signed)
[facility] called and spoke with Wayne Goodwin, [facility] who states they did receive rx sent on 02/23/22 #90/0. It was filled but he was going to have to look into what amount pt got when he pu, wasn't sure if he received 90 days or not but would reach out to pt as well regarding this since he should have medication left if got #90 on 7/20. I called pt and left detailed VM on work #, was unable to leave VM on home # that pt called in on.

## 2022-04-12 ENCOUNTER — Other Ambulatory Visit: Payer: Self-pay | Admitting: Family Medicine

## 2022-04-12 DIAGNOSIS — N401 Enlarged prostate with lower urinary tract symptoms: Secondary | ICD-10-CM

## 2022-05-02 ENCOUNTER — Encounter (INDEPENDENT_AMBULATORY_CARE_PROVIDER_SITE_OTHER): Payer: BC Managed Care – PPO | Admitting: Ophthalmology

## 2022-05-02 ENCOUNTER — Telehealth: Payer: Self-pay

## 2022-05-02 NOTE — Patient Outreach (Signed)
  Care Coordination   05/02/2022 Name: Wayne Goodwin MRN: [id number] DOB: 03/12/1951   Care Coordination Outreach Attempts:  An unsuccessful telephone outreach was attempted today to offer the patient information about available care coordination services as a benefit of their health plan.   Follow Up Plan:  Additional outreach attempts will be made to offer the patient care coordination information and services.   Encounter Outcome:  No Answer  Care Coordination Interventions Activated:  No   Care Coordination Interventions:  No, not indicated    Noreene Larsson RN, MSN, [facility]  Mobile: [phone]

## 2022-05-04 ENCOUNTER — Other Ambulatory Visit: Payer: Self-pay | Admitting: Family Medicine

## 2022-05-04 DIAGNOSIS — M10041 Idiopathic gout, right hand: Secondary | ICD-10-CM

## 2022-05-11 DIAGNOSIS — M170 Bilateral primary osteoarthritis of knee: Secondary | ICD-10-CM | POA: Diagnosis not present

## 2022-05-22 ENCOUNTER — Ambulatory Visit: Payer: BC Managed Care – PPO | Admitting: Dermatology

## 2022-06-06 ENCOUNTER — Telehealth: Payer: Self-pay | Admitting: Family Medicine

## 2022-06-06 DIAGNOSIS — I10 Essential (primary) hypertension: Secondary | ICD-10-CM

## 2022-06-06 MED ORDER — AMLODIPINE BESY-BENAZEPRIL HCL 10-40 MG PO CAPS: ORAL_CAPSULE | ORAL | 0 refills | Status: DC

## 2022-06-06 NOTE — Telephone Encounter (Signed)
Pharmacy requesting 60 day supply of  amLODipine-benazepril (LOTREL) 10-40 MG capsule [[id number]]

## 2022-06-07 ENCOUNTER — Encounter: Payer: Self-pay | Admitting: Physician Assistant

## 2022-06-07 ENCOUNTER — Ambulatory Visit: Payer: BC Managed Care – PPO | Admitting: Physician Assistant

## 2022-06-07 VITALS — BP 131/83 | HR 83 | Ht 72.0 in

## 2022-06-07 DIAGNOSIS — I10 Essential (primary) hypertension: Secondary | ICD-10-CM

## 2022-06-07 DIAGNOSIS — Z23 Encounter for immunization: Secondary | ICD-10-CM

## 2022-06-07 NOTE — Progress Notes (Signed)
I,Sha'taria Tyson,acting as a Education administrator for [employer], PA-C.,have documented all relevant documentation on the behalf of Mikey Kirschner, PA-C,as directed by  Mikey Kirschner, PA-C while in the presence of Mikey Kirschner, PA-C.   Established patient visit   Patient: Wayne Goodwin   DOB: 1950-12-17   71 y.o. Male  MRN: [id number] Visit Date: 06/07/2022  Today's healthcare provider: Mikey Kirschner, PA-C   Cc. Htn f/u  Subjective    HPI  Hypertension, follow-up  BP Readings from Last 3 Encounters:  06/07/22 131/83  11/21/21 (!) 149/83  06/07/21 125/87   Wt Readings from Last 3 Encounters:  11/21/21 276 lb (125.2 kg)  06/07/21 282 lb (127.9 kg)  01/12/21 298 lb (135.2 kg)     He was last seen for hypertension 6 months ago.  BP at that visit was 149/83. Management since that visit includes continue current treatment.  He reports excellent compliance with treatment. He is not having side effects.  He is following a Regular diet. He is exercising. He does not smoke.  Outside blood pressures are checked on occassions Symptoms: No chest pain No chest pressure  No palpitations No syncope  No dyspnea No orthopnea  No paroxysmal nocturnal dyspnea Yes lower extremity edema   Pertinent labs Lab Results  Component Value Date   CHOL 162 11/21/2021   HDL 46 11/21/2021   LDLCALC 81 11/21/2021   TRIG 210 (H) 11/21/2021   CHOLHDL 3.5 11/21/2021   Lab Results  Component Value Date   NA 140 11/21/2021   K 4.5 11/21/2021   CREATININE 1.07 11/21/2021   EGFR 75 11/21/2021   GLUCOSE 120 (H) 11/21/2021   TSH 2.120 11/21/2021     The 10-year ASCVD risk score (Arnett DK, et al., 2019) is: 20.3%  --------------------------------------------------------------------------------------------------- Medications: Outpatient Medications Prior to Visit  Medication Sig   allopurinol (ZYLOPRIM) 300 MG tablet TAKE 1 TABLET BY MOUTH DAILY   amLODipine-benazepril (LOTREL) 10-40 MG  capsule TAKE 1 CAPSULE BY MOUTH ONCE DAILY   aspirin EC 81 MG tablet Take 81 mg by mouth.   Bioflavonoid Products (BIOFLEX PO) Take 2 capsules by mouth daily.   colchicine 0.6 MG tablet Take 1 tablet (0.6 mg total) by mouth daily.   fluorouracil (EFUDEX) 5 % cream Apply topically 2 (two) times daily. Apply to left cheek for 1 week   indomethacin (INDOCIN) 50 MG capsule TAKE ONE CAPSULE THREE TIMES A DAY AS NEEDED FOR GOUT   Milk Thistle 250 MG CAPS Take 1 capsule by mouth daily.   Multiple Vitamin (MULTIVITAMIN) tablet Take 1 tablet by mouth daily.   NON FORMULARY CBD Oil   phentermine (ADIPEX-P) 37.5 MG tablet TAKE 1 TABLET BY MOUTH DAILY FOR APPETITE SUPPRESSION   rosuvastatin (CRESTOR) 10 MG tablet Take 1 tablet (10 mg total) by mouth daily.   tamsulosin (FLOMAX) 0.4 MG CAPS capsule TAKE 1 CAPSULE BY MOUTH ONCE DAILY   zolpidem (AMBIEN) 5 MG tablet Take 1 tablet (5 mg total) by mouth at bedtime as needed for sleep.   No facility-administered medications prior to visit.      Objective    Blood pressure 131/83, pulse 83, height 6' (1.829 m).   Physical Exam Constitutional:      General: He is awake.     Appearance: He is well-developed.  HENT:     Head: Normocephalic.  Eyes:     Conjunctiva/sclera: Conjunctivae normal.  Cardiovascular:     Rate and Rhythm: Normal rate and regular  rhythm.     Heart sounds: Normal heart sounds.  Pulmonary:     Effort: Pulmonary effort is normal.     Breath sounds: Normal breath sounds.  Skin:    General: Skin is warm.  Neurological:     Mental Status: He is alert and oriented to person, place, and time.  Psychiatric:        Attention and Perception: Attention normal.        Mood and Affect: Mood normal.        Speech: Speech normal.        Behavior: Behavior is cooperative.      No results found for any visits on 06/07/22.  Assessment & Plan     Problem List Items Addressed This Visit       Cardiovascular and Mediastinum    Essential hypertension - Primary    Chronic, well controlled Continue current medications Reviewed cmp F/u 6 mo      Other Visit Diagnoses     Need for immunization against influenza       Relevant Orders   Flu Vaccine QUAD High Dose(Fluad) (Completed)       Return in about 6 months (around 12/06/2022) for CPE.      I, Mikey Kirschner, PA-C have reviewed all documentation for this visit. The documentation on  06/07/2022 for the exam, diagnosis, procedures, and orders are all accurate and complete.  Mikey Kirschner, PA-C [facility] [address] [place], [place], [address] Office: [phone] Fax: [place]

## 2022-06-07 NOTE — Assessment & Plan Note (Signed)
Chronic, well controlled Continue current medications Reviewed cmp F/u 6 mo

## 2022-06-13 ENCOUNTER — Encounter (INDEPENDENT_AMBULATORY_CARE_PROVIDER_SITE_OTHER): Payer: BC Managed Care – PPO | Admitting: Ophthalmology

## 2022-06-13 DIAGNOSIS — H43813 Vitreous degeneration, bilateral: Secondary | ICD-10-CM

## 2022-06-13 DIAGNOSIS — I10 Essential (primary) hypertension: Secondary | ICD-10-CM | POA: Diagnosis not present

## 2022-06-13 DIAGNOSIS — H338 Other retinal detachments: Secondary | ICD-10-CM | POA: Diagnosis not present

## 2022-06-13 DIAGNOSIS — H35373 Puckering of macula, bilateral: Secondary | ICD-10-CM

## 2022-06-13 DIAGNOSIS — H35033 Hypertensive retinopathy, bilateral: Secondary | ICD-10-CM

## 2022-07-06 ENCOUNTER — Other Ambulatory Visit: Payer: Self-pay | Admitting: Physician Assistant

## 2022-07-06 ENCOUNTER — Telehealth: Payer: Self-pay | Admitting: Family Medicine

## 2022-07-06 MED ORDER — PHENTERMINE HCL 37.5 MG PO TABS: ORAL_TABLET | ORAL | 2 refills | Status: DC

## 2022-07-06 NOTE — Telephone Encounter (Signed)
[facility] faxed refill request for the following medications:  phentermine (ADIPEX-P) 37.5 MG tablet    Please advise.

## 2022-07-13 DIAGNOSIS — M170 Bilateral primary osteoarthritis of knee: Secondary | ICD-10-CM | POA: Diagnosis not present

## 2022-07-19 DIAGNOSIS — M170 Bilateral primary osteoarthritis of knee: Secondary | ICD-10-CM | POA: Diagnosis not present

## 2022-07-27 DIAGNOSIS — M170 Bilateral primary osteoarthritis of knee: Secondary | ICD-10-CM | POA: Diagnosis not present

## 2022-08-10 ENCOUNTER — Ambulatory Visit: Payer: BC Managed Care – PPO | Admitting: Dermatology

## 2022-08-10 ENCOUNTER — Encounter: Payer: Self-pay | Admitting: Dermatology

## 2022-08-10 VITALS — BP 140/92 | HR 88

## 2022-08-10 DIAGNOSIS — L578 Other skin changes due to chronic exposure to nonionizing radiation: Secondary | ICD-10-CM

## 2022-08-10 DIAGNOSIS — Z1283 Encounter for screening for malignant neoplasm of skin: Secondary | ICD-10-CM

## 2022-08-10 DIAGNOSIS — L821 Other seborrheic keratosis: Secondary | ICD-10-CM

## 2022-08-10 DIAGNOSIS — Z79899 Other long term (current) drug therapy: Secondary | ICD-10-CM

## 2022-08-10 DIAGNOSIS — D692 Other nonthrombocytopenic purpura: Secondary | ICD-10-CM

## 2022-08-10 DIAGNOSIS — L570 Actinic keratosis: Secondary | ICD-10-CM | POA: Diagnosis not present

## 2022-08-10 DIAGNOSIS — B351 Tinea unguium: Secondary | ICD-10-CM

## 2022-08-10 DIAGNOSIS — L814 Other melanin hyperpigmentation: Secondary | ICD-10-CM

## 2022-08-10 DIAGNOSIS — I872 Venous insufficiency (chronic) (peripheral): Secondary | ICD-10-CM

## 2022-08-10 DIAGNOSIS — Z85828 Personal history of other malignant neoplasm of skin: Secondary | ICD-10-CM

## 2022-08-10 DIAGNOSIS — D229 Melanocytic nevi, unspecified: Secondary | ICD-10-CM

## 2022-08-10 DIAGNOSIS — Z86018 Personal history of other benign neoplasm: Secondary | ICD-10-CM

## 2022-08-10 MED ORDER — TERBINAFINE HCL 250 MG PO TABS: 250.0000 mg | ORAL_TABLET | Freq: Every day | ORAL | 0 refills | Status: DC

## 2022-08-10 NOTE — Patient Instructions (Addendum)
Stasis in the legs causes chronic leg swelling, which may result in itchy or painful rashes, skin discoloration, skin texture changes, and sometimes ulceration.  Recommend daily graduated compression hose/stockings- easiest to put on first thing in morning, remove at bedtime.  Elevate legs as much as possible. Avoid salt/sodium rich foods.   Terbinafine Counseling  Rare side effects can include irritation of the liver, allergic reaction, or decrease in blood counts (which may show up as not feeling well or developing an infection). If you are concerned about any of these side effects, please stop the medicine and call your doctor, or in the case of an emergency such as feeling very unwell, seek immediate medical care.    Actinic keratoses are precancerous spots that appear secondary to cumulative UV radiation exposure/sun exposure over time. They are chronic with expected duration over 1 year. A portion of actinic keratoses will progress to squamous cell carcinoma of the skin. It is not possible to reliably predict which spots will progress to skin cancer and so treatment is recommended to prevent development of skin cancer.  Recommend daily broad spectrum sunscreen SPF 30+ to sun-exposed areas, reapply every 2 hours as needed.  Recommend staying in the shade or wearing long sleeves, sun glasses (UVA+UVB protection) and wide brim hats (4-inch brim around the entire circumference of the hat). Call for new or changing lesions.   Cryotherapy Aftercare  Wash gently with soap and water everyday.   Apply Vaseline and Band-Aid daily until healed.    Melanoma ABCDEs  Melanoma is the most dangerous type of skin cancer, and is the leading cause of death from skin disease.  You are more likely to develop melanoma if you: Have light-colored skin, light-colored eyes, or red or blond hair Spend a lot of time in the sun Tan regularly, either outdoors or in a tanning bed Have had blistering sunburns,  especially during childhood Have a close family member who has had a melanoma Have atypical moles or large birthmarks  Early detection of melanoma is key since treatment is typically straightforward and cure rates are extremely high if we catch it early.   The first sign of melanoma is often a change in a mole or a new dark spot.  The ABCDE system is a way of remembering the signs of melanoma.  A for asymmetry:  The two halves do not match. B for border:  The edges of the growth are irregular. C for color:  A mixture of colors are present instead of an even brown color. D for diameter:  Melanomas are usually (but not always) greater than 36m - the size of a pencil eraser. E for evolution:  The spot keeps changing in size, shape, and color.  Please check your skin once per month between visits. You can use a small mirror in front and a large mirror behind you to keep an eye on the back side or your body.   If you see any new or changing lesions before your next follow-up, please call to schedule a visit.  Please continue daily skin protection including broad spectrum sunscreen SPF 30+ to sun-exposed areas, reapplying every 2 hours as needed when you're outdoors.   Staying in the shade or wearing long sleeves, sun glasses (UVA+UVB protection) and wide brim hats (4-inch brim around the entire circumference of the hat) are also recommended for sun protection.    Due to recent changes in healthcare laws, you may see results of your pathology and/or laboratory  studies on MyChart before the doctors have had a chance to review them. We understand that in some cases there may be results that are confusing or concerning to you. Please understand that not all results are received at the same time and often the doctors may need to interpret multiple results in order to provide you with the best plan of care or course of treatment. Therefore, we ask that you please give [place] 2 business days to thoroughly  review all your results before contacting the office for clarification. Should we see a critical lab result, you will be contacted sooner.   If You Need Anything After Your Visit  If you have any questions or concerns for your doctor, please call our main line at [phone] and press option 4 to reach your doctor's medical assistant. If no one answers, please leave a voicemail as directed and we will return your call as soon as possible. Messages left after 4 pm will be answered the following business day.   You may also send [place] a message via [place]. We typically respond to MyChart messages within 1-2 business days.  For prescription refills, please ask your pharmacy to contact our office. Our fax number is [phone].  If you have an urgent issue when the clinic is closed that cannot wait until the next business day, you can page your doctor at the number below.    Please note that while we do our best to be available for urgent issues outside of office hours, we are not available 24/7.   If you have an urgent issue and are unable to reach [place], you may choose to seek medical care at your doctor's office, retail clinic, urgent care center, or emergency room.  If you have a medical emergency, please immediately call 911 or go to the emergency department.  Pager Numbers  - Dr. Nehemiah Massed: [phone]  - Dr. Laurence Ferrari: [phone]  - Dr. Nicole Kindred: [phone]  In the event of inclement weather, please call our main line at [phone] for an update on the status of any delays or closures.  Dermatology Medication Tips: Please keep the boxes that topical medications come in in order to help keep track of the instructions about where and how to use these. Pharmacies typically print the medication instructions only on the boxes and not directly on the medication tubes.   If your medication is too expensive, please contact our office at [phone] option 4 or send [place] a message through  [place].   We are unable to tell what your co-pay for medications will be in advance as this is different depending on your insurance coverage. However, we may be able to find a substitute medication at lower cost or fill out paperwork to get insurance to cover a needed medication.   If a prior authorization is required to get your medication covered by your insurance company, please allow [place] 1-2 business days to complete this process.  Drug prices often vary depending on where the prescription is filled and some pharmacies may offer cheaper prices.  The website www.goodrx.com contains coupons for medications through different pharmacies. The prices here do not account for what the cost may be with help from insurance (it may be cheaper with your insurance), but the website can give you the price if you did not use any insurance.  - You can print the associated coupon and take it with your prescription to the pharmacy.  - You may also stop by our office during  regular business hours and pick up a GoodRx coupon card.  - If you need your prescription sent electronically to a different pharmacy, notify our office through [facility] or by phone at [phone] option 4.     Si Usted Necesita Algo Despus de Su Visita  Tambin puede enviarnos un mensaje a travs de Pharmacist, community. Por lo general respondemos a los mensajes de MyChart en el transcurso de 1 a 2 das hbiles.  Para renovar recetas, por favor pida a su farmacia que se ponga en contacto con nuestra oficina. Harland Dingwall de fax es [place] [phone].  Si tiene un asunto urgente cuando la clnica est cerrada y que no puede esperar hasta el siguiente da hbil, puede llamar/localizar a su doctor(a) al nmero que aparece a continuacin.   Por favor, tenga en cuenta que aunque hacemos todo lo posible para estar disponibles para asuntos urgentes fuera del horario de [place], no estamos disponibles las 24 horas del da, los 7 das de la  [place].   Si tiene un problema urgente y no puede comunicarse con nosotros, puede optar por buscar atencin mdica  en el consultorio de su doctor(a), en una clnica privada, en un centro de atencin urgente o en una sala de emergencias.  Si tiene Engineering geologist, por favor llame inmediatamente al 911 o vaya a la sala de emergencias.  Nmeros de bper  - Dr. Nehemiah Massed: [phone]  - Dra. Moye: [phone]  - Dra. Nicole Kindred: [phone]  En caso de inclemencias del [place], por favor llame a Johnsie Kindred principal al [phone] para una actualizacin sobre el [place] de cualquier retraso o cierre.  Consejos para la medicacin en dermatologa: Por favor, guarde las cajas en las que vienen los medicamentos de uso tpico para ayudarle a seguir las instrucciones sobre dnde y cmo usarlos. Las farmacias generalmente imprimen las instrucciones del medicamento slo en las cajas y no directamente en los tubos del [place].   Si su medicamento es muy caro, por favor, pngase en contacto con Zigmund Daniel llamando al [phone] y presione la opcin 4 o envenos un mensaje a travs de Pharmacist, community.   No podemos decirle cul ser su copago por los medicamentos por adelantado ya que esto es diferente dependiendo de la cobertura de su seguro. Sin embargo, es posible que podamos encontrar un medicamento sustituto a Electrical engineer un formulario para que el seguro cubra el medicamento que se considera necesario.   Si se requiere una autorizacin previa para que su compaa de seguros [place] su medicamento, por favor permtanos de 1 a 2 das hbiles para completar este proceso.  Los precios de los medicamentos varan con frecuencia dependiendo del Environmental consultant de dnde se surte la receta y alguna farmacias pueden ofrecer precios ms baratos.  El sitio web www.goodrx.com tiene cupones para medicamentos de Airline pilot. Los precios aqu no tienen en cuenta lo que podra costar con la ayuda del  seguro (puede ser ms barato con su seguro), pero el sitio web puede darle el precio si no utiliz Research scientist (physical sciences).  - Puede imprimir el cupn correspondiente y llevarlo con su receta a la farmacia.  - Tambin puede pasar por nuestra oficina durante el horario de atencin regular y Charity fundraiser una tarjeta de cupones de GoodRx.  - Si necesita que su receta se enve electrnicamente a una farmacia diferente, informe a nuestra oficina a travs de MyChart de [place] o por telfono llamando al [phone] y presione la opcin 4.

## 2022-08-10 NOTE — Progress Notes (Signed)
Follow-Up Visit   Subjective  Wayne Goodwin is a 72 y.o. male who presents for the following: Annual Exam (Tbse, hx of bcc , hx of aks, hx of dysplastic ). The patient presents for Total-Body Skin Exam (TBSE) for skin cancer screening and mole check.  The patient has spots, moles and lesions to be evaluated, some may be new or changing and the patient has concerns that these could be cancer.  The following portions of the chart were reviewed this encounter and updated as appropriate:  Tobacco  Allergies  Meds  Problems  Med Hx  Surg Hx  Fam Hx     Review of Systems: No other skin or systemic complaints except as noted in HPI or Assessment and Plan.  Objective  Well appearing patient in no apparent distress; mood and affect are within normal limits.  A full examination was performed including scalp, head, eyes, ears, nose, lips, neck, chest, axillae, abdomen, back, buttocks, bilateral upper extremities, bilateral lower extremities, hands, feet, fingers, toes, fingernails, and toenails. All findings within normal limits unless otherwise noted below.  left ear x 1 Erythematous thin papules/macules with gritty scale.   b/l lower legs Stasis changes   toenails Thickened toenails with subungal debris c/w onychomycosis    Assessment & Plan  Actinic keratosis left ear x 1 Will recheck at next follow up Actinic keratoses are precancerous spots that appear secondary to cumulative UV radiation exposure/sun exposure over time. They are chronic with expected duration over 1 year. A portion of actinic keratoses will progress to squamous cell carcinoma of the skin. It is not possible to reliably predict which spots will progress to skin cancer and so treatment is recommended to prevent development of skin cancer.  Recommend daily broad spectrum sunscreen SPF 30+ to sun-exposed areas, reapply every 2 hours as needed.  Recommend staying in the shade or wearing long sleeves, sun glasses  (UVA+UVB protection) and wide brim hats (4-inch brim around the entire circumference of the hat). Call for new or changing lesions.  Destruction of lesion - left ear x 1 Complexity: simple   Destruction method: cryotherapy   Informed consent: discussed and consent obtained   Timeout:  patient name, date of birth, surgical site, and procedure verified Lesion destroyed using liquid nitrogen: Yes   Region frozen until ice ball extended beyond lesion: Yes   Outcome: patient tolerated procedure well with no complications   Post-procedure details: wound care instructions given   Additional details:  Prior to procedure, discussed risks of blister formation, small wound, skin dyspigmentation, or rare scar following cryotherapy. Recommend Vaseline ointment to treated areas while healing.  Stasis dermatitis of both legs b/l lower legs With schaumbergs purpura  Stasis in the legs causes chronic leg swelling, which may result in itchy or painful rashes, skin discoloration, skin texture changes, and sometimes ulceration.  Recommend daily graduated compression hose/stockings- easiest to put on first thing in morning, remove at bedtime.  Elevate legs as much as possible. Avoid salt/sodium rich foods.  Onychomycosis toenails Patient denies liver problems Review recent lab work from 11/2021 = normal liver tests  We discussed using Lamisil for onychomycosis. This drug offers a fairly high but not universal cure rate. A 12 week treatment course is recommended. The patient is aware that rare cases of liver injury have been reported; and agrees to come in for liver function tests at 6 weeks of treatment. The symptoms of liver disease have been discussed; call if such occurs. In  addition, some insurance plans do not cover the expense of this drug for treating a cosmetic condition, and the patient understands they may have to pay for the medication. Other side effects, such as headaches and rashes, have also been  discussed.  Start Terbinafine 250 mg tablet - take 1 tab po qd for 1 month. Will recheck in 6 weeks.     Terbinafine is an anti-fungal medicine that can be applied to the skin (over the counter) or taken by mouth (prescription) to treat fungal infections. The pill version is often used to treat fungal infections of the nails or scalp. While most people do not have any side effects from taking terbinafine pills, some possible side effects of the medicine can include taste changes, headache, loss of smell, vision changes, nausea, vomiting, or diarrhea.    Rare side effects can include irritation of the liver, allergic reaction, or decrease in blood counts (which may show up as not feeling well or developing an infection). If you are concerned about any of these side effects, please stop the medicine and call your doctor, or in the case of an emergency such as feeling very unwell, seek immediate medical care.   terbinafine (LAMISIL) 250 MG tablet - toenails Take 1 tablet (250 mg total) by mouth daily.  Lentigines - Scattered tan macules - Due to sun exposure - Benign-appearing, observe - Recommend daily broad spectrum sunscreen SPF 30+ to sun-exposed areas, reapply every 2 hours as needed. - Call for any changes  Seborrheic Keratoses - Stuck-on, waxy, tan-brown papules and/or plaques  - Benign-appearing - Discussed benign etiology and prognosis. - Observe - Call for any changes  Acrochordons (Skin Tags) - Fleshy, skin-colored pedunculated papules - Benign appearing.  - Observe. - If desired, they can be removed with an in office procedure that is not covered by insurance. - Please call the clinic if you notice any new or changing lesions.  Melanocytic Nevi - Tan-brown and/or pink-flesh-colored symmetric macules and papules - Benign appearing on exam today - Observation - Call clinic for new or changing moles - Recommend daily use of broad spectrum spf 30+ sunscreen to sun-exposed  areas.   Hemangiomas - Red papules - Discussed benign nature - Observe - Call for any changes  Purpura - Chronic; persistent and recurrent.  Treatable, but not curable. Area at left zygoma  At arms  - Violaceous macules and patches - Benign - Related to trauma, age, sun damage and/or use of blood thinners, chronic use of topical and/or oral steroids - Observe - Can use OTC arnica containing moisturizer such as Dermend Bruise Formula if desired - Call for worsening or other concerns  Actinic Damage - Chronic condition, secondary to cumulative UV/sun exposure - diffuse scaly erythematous macules with underlying dyspigmentation - Recommend daily broad spectrum sunscreen SPF 30+ to sun-exposed areas, reapply every 2 hours as needed.  - Staying in the shade or wearing long sleeves, sun glasses (UVA+UVB protection) and wide brim hats (4-inch brim around the entire circumference of the hat) are also recommended for sun protection.  - Call for new or changing lesions.  History of Basal Cell Carcinoma of the Skin Multiple sites see history  - No evidence of recurrence today - Recommend regular full body skin exams - Recommend daily broad spectrum sunscreen SPF 30+ to sun-exposed areas, reapply every 2 hours as needed.  - Call if any new or changing lesions are noted between office visits  History of Dysplastic Nevi Left upper back  medial to mid scapula  2010 - No evidence of recurrence today - Recommend regular full body skin exams - Recommend daily broad spectrum sunscreen SPF 30+ to sun-exposed areas, reapply every 2 hours as needed.  - Call if any new or changing lesions are noted between office visits  Skin cancer screening performed today. Return for 6 week ak follow up and recheck left zygoma .  IRuthell Rummage, CMA, am acting as scribe for Sarina Ser, MD. Documentation: I have reviewed the above documentation for accuracy and completeness, and I agree with the  above.  Sarina Ser, MD

## 2022-08-11 ENCOUNTER — Encounter: Payer: Self-pay | Admitting: Dermatology

## 2022-08-30 ENCOUNTER — Telehealth: Payer: Self-pay | Admitting: Family Medicine

## 2022-08-30 ENCOUNTER — Other Ambulatory Visit: Payer: Self-pay

## 2022-08-30 DIAGNOSIS — E7800 Pure hypercholesterolemia, unspecified: Secondary | ICD-10-CM

## 2022-08-30 MED ORDER — ROSUVASTATIN CALCIUM 10 MG PO TABS: 10.0000 mg | ORAL_TABLET | Freq: Every day | ORAL | 0 refills | Status: DC

## 2022-08-30 NOTE — Telephone Encounter (Signed)
[facility] pharmacy faxed refill request for the following medications:   rosuvastatin (CRESTOR) 10 MG tablet    Please advise

## 2022-08-30 NOTE — Telephone Encounter (Signed)
Refill sent.  KP

## 2022-09-06 DIAGNOSIS — M170 Bilateral primary osteoarthritis of knee: Secondary | ICD-10-CM | POA: Diagnosis not present

## 2022-09-07 ENCOUNTER — Other Ambulatory Visit: Payer: Self-pay | Admitting: Physician Assistant

## 2022-09-07 DIAGNOSIS — I10 Essential (primary) hypertension: Secondary | ICD-10-CM

## 2022-09-07 NOTE — Telephone Encounter (Signed)
Requested Prescriptions  Pending Prescriptions Disp Refills   amLODipine-benazepril (LOTREL) 10-40 MG capsule [Pharmacy Med Name: AMLODIPINE-BENAZEPRIL 10-40 MG CAP] 90 capsule 0    Sig: TAKE 1 CAPSULE BY MOUTH EVERY DAY     Cardiovascular: CCB + ACEI Combos Failed - 09/07/2022  9:43 AM      Failed - Cr in normal range and within 180 days    Creat  Date Value Ref Range Status  05/01/2017 0.89 0.70 - 1.25 mg/dL Final    Comment:    For patients >40 years of age, the reference limit for Creatinine is approximately 13% higher for people identified as African-American. .    Creatinine, Ser  Date Value Ref Range Status  11/21/2021 1.07 0.76 - 1.27 mg/dL Final         Failed - K in normal range and within 180 days    Potassium  Date Value Ref Range Status  11/21/2021 4.5 3.5 - 5.2 mmol/L Final         Failed - Na in normal range and within 180 days    Sodium  Date Value Ref Range Status  11/21/2021 140 134 - 144 mmol/L Final         Failed - eGFR is 30 or above and within 180 days    GFR calc Af Amer  Date Value Ref Range Status  05/13/2020 95 >59 mL/min/1.73 Final    Comment:    **[facility] currently reports eGFR in compliance with the current**   recommendations of the [employer]. [facility] will   update reporting as new guidelines are published from the NKF-ASN   Task force.    GFR calc non Af Amer  Date Value Ref Range Status  05/13/2020 82 >59 mL/min/1.73 Final   eGFR  Date Value Ref Range Status  11/21/2021 75 >59 mL/min/1.73 Final         Failed - Last BP in normal range    BP Readings from Last 1 Encounters:  08/10/22 (!) 140/92         Passed - Patient is not pregnant      Passed - Valid encounter within last 6 months    Recent Outpatient Visits           3 months ago Essential hypertension   [place] Mikey Kirschner, PA-C   9 months ago Essential hypertension   [place]  Eulas Post, MD   1 year ago Essential hypertension   [place] Eulas Post, MD   1 year ago Annual physical exam   [facility] Eulas Post, MD   2 years ago Annual physical exam   [facility] Eulas Post, MD       Future Appointments             In 1 week Ralene Bathe, MD [place]   In 2 months Ralene Bathe, MD [place]

## 2022-09-11 ENCOUNTER — Other Ambulatory Visit: Payer: Self-pay | Admitting: Physician Assistant

## 2022-09-11 DIAGNOSIS — G4700 Insomnia, unspecified: Secondary | ICD-10-CM

## 2022-09-14 ENCOUNTER — Ambulatory Visit: Payer: BC Managed Care – PPO | Admitting: Dermatology

## 2022-09-14 DIAGNOSIS — Z79899 Other long term (current) drug therapy: Secondary | ICD-10-CM | POA: Diagnosis not present

## 2022-09-14 DIAGNOSIS — L578 Other skin changes due to chronic exposure to nonionizing radiation: Secondary | ICD-10-CM

## 2022-09-14 DIAGNOSIS — L570 Actinic keratosis: Secondary | ICD-10-CM

## 2022-09-14 DIAGNOSIS — B351 Tinea unguium: Secondary | ICD-10-CM

## 2022-09-14 MED ORDER — TERBINAFINE HCL 250 MG PO TABS: 250.0000 mg | ORAL_TABLET | Freq: Every day | ORAL | 1 refills | Status: AC

## 2022-09-14 NOTE — Patient Instructions (Addendum)
Cryotherapy Aftercare  Wash gently with soap and water everyday.   Apply Vaseline and Band-Aid daily until healed.     Due to recent changes in healthcare laws, you may see results of your pathology and/or laboratory studies on MyChart before the doctors have had a chance to review them. We understand that in some cases there may be results that are confusing or concerning to you. Please understand that not all results are received at the same time and often the doctors may need to interpret multiple results in order to provide you with the best plan of care or course of treatment. Therefore, we ask that you please give us 2 business days to thoroughly review all your results before contacting the office for clarification. Should we see a critical lab result, you will be contacted sooner.   If You Need Anything After Your Visit  If you have any questions or concerns for your doctor, please call our main line at [phone] and press option 4 to reach your doctor's medical assistant. If no one answers, please leave a voicemail as directed and we will return your call as soon as possible. Messages left after 4 pm will be answered the following business day.   You may also send us a message via MyChart. We typically respond to MyChart messages within 1-2 business days.  For prescription refills, please ask your pharmacy to contact our office. Our fax number is [phone].  If you have an urgent issue when the clinic is closed that cannot wait until the next business day, you can page your doctor at the number below.    Please note that while we do our best to be available for urgent issues outside of office hours, we are not available 24/7.   If you have an urgent issue and are unable to reach us, you may choose to seek medical care at your doctor's office, retail clinic, urgent care center, or emergency room.  If you have a medical emergency, please immediately call 911 or go to the  emergency department.  Pager Numbers  - Dr. Kowalski: [phone]  - Dr. Moye: [phone]  - Dr. Stewart: [phone]  In the event of inclement weather, please call our main line at [phone] for an update on the status of any delays or closures.  Dermatology Medication Tips: Please keep the boxes that topical medications come in in order to help keep track of the instructions about where and how to use these. Pharmacies typically print the medication instructions only on the boxes and not directly on the medication tubes.   If your medication is too expensive, please contact our office at [phone] option 4 or send us a message through MyChart.   We are unable to tell what your co-pay for medications will be in advance as this is different depending on your insurance coverage. However, we may be able to find a substitute medication at lower cost or fill out paperwork to get insurance to cover a needed medication.   If a prior authorization is required to get your medication covered by your insurance company, please allow us 1-2 business days to complete this process.  Drug prices often vary depending on where the prescription is filled and some pharmacies may offer cheaper prices.  The website www.goodrx.com contains coupons for medications through different pharmacies. The prices here do not account for what the cost may be with help from insurance (it may be cheaper with your insurance), but the website can   give you the price if you did not use any insurance.  - You can print the associated coupon and take it with your prescription to the pharmacy.  - You may also stop by our office during regular business hours and pick up a GoodRx coupon card.  - If you need your prescription sent electronically to a different pharmacy, notify our office through [facility] MyChart or by phone at [phone] option 4.     Si Usted Necesita Algo Despus de Su Visita  Tambin puede  enviarnos un mensaje a travs de MyChart. Por lo general respondemos a los mensajes de MyChart en el transcurso de 1 a 2 das hbiles.  Para renovar recetas, por favor pida a su farmacia que se ponga en contacto con nuestra oficina. Nuestro nmero de fax es el [phone].  Si tiene un asunto urgente cuando la clnica est cerrada y que no puede esperar hasta el siguiente da hbil, puede llamar/localizar a su doctor(a) al nmero que aparece a continuacin.   Por favor, tenga en cuenta que aunque hacemos todo lo posible para estar disponibles para asuntos urgentes fuera del horario de oficina, no estamos disponibles las 24 horas del da, los 7 das de la semana.   Si tiene un problema urgente y no puede comunicarse con nosotros, puede optar por buscar atencin mdica  en el consultorio de su doctor(a), en una clnica privada, en un centro de atencin urgente o en una sala de emergencias.  Si tiene una emergencia mdica, por favor llame inmediatamente al 911 o vaya a la sala de emergencias.  Nmeros de bper  - Dr. Kowalski: [phone]  - Dra. Moye: [phone]  - Dra. Stewart: [phone]  En caso de inclemencias del tiempo, por favor llame a nuestra lnea principal al [phone] para una actualizacin sobre el estado de cualquier retraso o cierre.  Consejos para la medicacin en dermatologa: Por favor, guarde las cajas en las que vienen los medicamentos de uso tpico para ayudarle a seguir las instrucciones sobre dnde y cmo usarlos. Las farmacias generalmente imprimen las instrucciones del medicamento slo en las cajas y no directamente en los tubos del medicamento.   Si su medicamento es muy caro, por favor, pngase en contacto con nuestra oficina llamando al [phone] y presione la opcin 4 o envenos un mensaje a travs de MyChart.   No podemos decirle cul ser su copago por los medicamentos por adelantado ya que esto es diferente dependiendo de la cobertura de su seguro.  Sin embargo, es posible que podamos encontrar un medicamento sustituto a menor costo o llenar un formulario para que el seguro cubra el medicamento que se considera necesario.   Si se requiere una autorizacin previa para que su compaa de seguros cubra su medicamento, por favor permtanos de 1 a 2 das hbiles para completar este proceso.  Los precios de los medicamentos varan con frecuencia dependiendo del lugar de dnde se surte la receta y alguna farmacias pueden ofrecer precios ms baratos.  El sitio web www.goodrx.com tiene cupones para medicamentos de diferentes farmacias. Los precios aqu no tienen en cuenta lo que podra costar con la ayuda del seguro (puede ser ms barato con su seguro), pero el sitio web puede darle el precio si no utiliz ningn seguro.  - Puede imprimir el cupn correspondiente y llevarlo con su receta a la farmacia.  - Tambin puede pasar por nuestra oficina durante el horario de atencin regular y recoger una tarjeta de cupones de GoodRx.  -   Si necesita que su receta se enve electrnicamente a una farmacia diferente, informe a nuestra oficina a travs de MyChart de [facility] o por telfono llamando al [phone] y presione la opcin 4.  

## 2022-09-14 NOTE — Progress Notes (Signed)
   Follow-Up Visit   Subjective  Wayne Goodwin is a 72 y.o. male who presents for the following: Follow-up. 1 month f/u on precancers on his face and ears. Recheck toenails treated with Terbinafine tablets for 1 month with a good response. The patient has spots, moles and lesions to be evaluated, some may be new or changing and the patient has concerns that these could be cancer.  The following portions of the chart were reviewed this encounter and updated as appropriate:   Tobacco  Allergies  Meds  Problems  Med Hx  Surg Hx  Fam Hx     Review of Systems:  No other skin or systemic complaints except as noted in HPI or Assessment and Plan.  Objective  Well appearing patient in no apparent distress; mood and affect are within normal limits.  A focused examination was performed including face, ears , toenails, feet . Relevant physical exam findings are noted in the Assessment and Plan.  face x 8 (8) Erythematous thin papules/macules with gritty scale.    Assessment & Plan  AK (actinic keratosis) (8) face x 8  Actinic keratoses are precancerous spots that appear secondary to cumulative UV radiation exposure/sun exposure over time. They are chronic with expected duration over 1 year. A portion of actinic keratoses will progress to squamous cell carcinoma of the skin. It is not possible to reliably predict which spots will progress to skin cancer and so treatment is recommended to prevent development of skin cancer.  Recommend daily broad spectrum sunscreen SPF 30+ to sun-exposed areas, reapply every 2 hours as needed.  Recommend staying in the shade or wearing long sleeves, sun glasses (UVA+UVB protection) and wide brim hats (4-inch brim around the entire circumference of the hat). Call for new or changing lesions.   Destruction of lesion - face x 8 Complexity: simple   Destruction method: cryotherapy   Informed consent: discussed and consent obtained   Timeout:  patient name,  date of birth, surgical site, and procedure verified Lesion destroyed using liquid nitrogen: Yes   Region frozen until ice ball extended beyond lesion: Yes   Outcome: patient tolerated procedure well with no complications   Post-procedure details: wound care instructions given    Actinic Damage - chronic, secondary to cumulative UV radiation exposure/sun exposure over time - diffuse scaly erythematous macules with underlying dyspigmentation - Recommend daily broad spectrum sunscreen SPF 30+ to sun-exposed areas, reapply every 2 hours as needed.  - Recommend staying in the shade or wearing long sleeves, sun glasses (UVA+UVB protection) and wide brim hats (4-inch brim around the entire circumference of the hat). - Call for new or changing lesions.  Onychomycosis feet - Anterior Chronic and persistent condition with duration or expected duration over one year. Condition is symptomatic / bothersome to patient. Not to goal. Tolerating medication well, no side effects.  Improving.  Related Medications terbinafine (LAMISIL) 250 MG tablet Take 1 tablet (250 mg total) by mouth daily.   Return in about 6 months (around 03/15/2023) for Onychomycosis, Aks .  IMarye Round, CMA, am acting as scribe for Sarina Ser, MD .  Documentation: I have reviewed the above documentation for accuracy and completeness, and I agree with the above.  Sarina Ser, MD

## 2022-09-26 ENCOUNTER — Encounter: Payer: Self-pay | Admitting: Dermatology

## 2022-11-15 ENCOUNTER — Ambulatory Visit: Payer: BC Managed Care – PPO | Admitting: Dermatology

## 2022-11-15 VITALS — BP 108/62

## 2022-11-15 DIAGNOSIS — L570 Actinic keratosis: Secondary | ICD-10-CM

## 2022-11-15 DIAGNOSIS — C44519 Basal cell carcinoma of skin of other part of trunk: Secondary | ICD-10-CM | POA: Diagnosis not present

## 2022-11-15 DIAGNOSIS — C44319 Basal cell carcinoma of skin of other parts of face: Secondary | ICD-10-CM | POA: Diagnosis not present

## 2022-11-15 DIAGNOSIS — L578 Other skin changes due to chronic exposure to nonionizing radiation: Secondary | ICD-10-CM | POA: Diagnosis not present

## 2022-11-15 DIAGNOSIS — Z85828 Personal history of other malignant neoplasm of skin: Secondary | ICD-10-CM | POA: Diagnosis not present

## 2022-11-15 DIAGNOSIS — D492 Neoplasm of unspecified behavior of bone, soft tissue, and skin: Secondary | ICD-10-CM

## 2022-11-15 NOTE — Progress Notes (Signed)
Follow-Up Visit   Subjective  Wayne Goodwin is a 72 y.o. male who presents for the following: purpura L zygoma f/u The patient has spots, moles and lesions to be evaluated, some may be new or changing and the patient has concerns that these could be cancer.  The following portions of the chart were reviewed this encounter and updated as appropriate: medications, allergies, medical history  Review of Systems:  No other skin or systemic complaints except as noted in HPI or Assessment and Plan.  Objective  Well appearing patient in no apparent distress; mood and affect are within normal limits. A focused examination was performed of the following areas: face Relevant exam findings are noted in the Assessment and Plan.  left zygoma ant/sup to previous bcc site Crusted patch with some surrounding irregular textured skin 1.2 x 1.0cm         Assessment & Plan   ACTINIC KERATOSIS Exam: Erythematous thin papules/macules with gritty scale  Actinic keratoses are precancerous spots that appear secondary to cumulative UV radiation exposure/sun exposure over time. They are chronic with expected duration over 1 year. A portion of actinic keratoses will progress to squamous cell carcinoma of the skin. It is not possible to reliably predict which spots will progress to skin cancer and so treatment is recommended to prevent development of skin cancer.  Recommend daily broad spectrum sunscreen SPF 30+ to sun-exposed areas, reapply every 2 hours as needed.  Recommend staying in the shade or wearing long sleeves, sun glasses (UVA+UVB protection) and wide brim hats (4-inch brim around the entire circumference of the hat). Call for new or changing lesions.  Treatment Plan:  Prior to procedure, discussed risks of blister formation, small wound, skin dyspigmentation, or rare scar following cryotherapy. Recommend Vaseline ointment to treated areas while healing.  Destruction Procedure  Note Destruction method: cryotherapy   Informed consent: discussed and consent obtained   Lesion destroyed using liquid nitrogen: Yes   Outcome: patient tolerated procedure well with no complications   Post-procedure details: wound care instructions given   Locations: left cheek # of Lesions Treated: 1    Neoplasm of skin left zygoma ant/sup to previous bcc site  Skin / nail biopsy Type of biopsy: tangential   Informed consent: discussed and consent obtained   Timeout: patient name, date of birth, surgical site, and procedure verified   Procedure prep:  Patient was prepped and draped in usual sterile fashion Prep type:  Isopropyl alcohol Anesthesia: the lesion was anesthetized in a standard fashion   Anesthetic:  1% lidocaine w/ epinephrine 1-100,000 buffered w/ 8.4% NaHCO3 Instrument used: flexible razor blade   Outcome: patient tolerated procedure well   Post-procedure details: sterile dressing applied and wound care instructions given   Dressing type: bandage and bacitracin    Specimen 1 - Surgical pathology Differential Diagnosis: D48.5 R/O BCC  Check Margins: No Crusted patch with some surrounding irregular textured skin 1.2 x 1.0cm  If skin cancer will plan mohs, discussed with patient  AK (actinic keratosis)  Actinic skin damage  History of basal cell carcinoma  ACTINIC DAMAGE - chronic, secondary to cumulative UV radiation exposure/sun exposure over time - diffuse scaly erythematous macules with underlying dyspigmentation - Recommend daily broad spectrum sunscreen SPF 30+ to sun-exposed areas, reapply every 2 hours as needed.  - Recommend staying in the shade or wearing long sleeves, sun glasses (UVA+UVB protection) and wide brim hats (4-inch brim around the entire circumference of the hat). - Call for  new or changing lesions.  HISTORY OF BASAL CELL CARCINOMA OF THE SKIN - L cheek. - No evidence of recurrence today - But new spot today biopsied is superior to  previous BCC treatment scar. - Recommend regular full body skin exams - Recommend daily broad spectrum sunscreen SPF 30+ to sun-exposed areas, reapply every 2 hours as needed.  - Call if any new or changing lesions are noted between office visits  Return for as scheduled for onychomycosis.  I, Ardis Rowan, RMA, am acting as scribe for Armida Sans, MD .  Documentation: I have reviewed the above documentation for accuracy and completeness, and I agree with the above.  Armida Sans, MD

## 2022-11-15 NOTE — Patient Instructions (Addendum)
Wound Care Instructions  Cleanse wound gently with soap and water once a day then pat dry with clean gauze. Apply a thin coat of Petrolatum (petroleum jelly, "Vaseline") over the wound (unless you have an allergy to this). We recommend that you use a new, sterile tube of Vaseline. Do not pick or remove scabs. Do not remove the yellow or white "healing tissue" from the base of the wound.  Cover the wound with fresh, clean, nonstick gauze and secure with paper tape. You may use Band-Aids in place of gauze and tape if the wound is small enough, but would recommend trimming much of the tape off as there is often too much. Sometimes Band-Aids can irritate the skin.  You should call the office for your biopsy report after 1 week if you have not already been contacted.  If you experience any problems, such as abnormal amounts of bleeding, swelling, significant bruising, significant pain, or evidence of infection, please call the office immediately.  FOR ADULT SURGERY PATIENTS: If you need something for pain relief you may take 1 extra strength Tylenol (acetaminophen) AND 2 Ibuprofen (200mg each) together every 4 hours as needed for pain. (do not take these if you are allergic to them or if you have a reason you should not take them.) Typically, you may only need pain medication for 1 to 3 days.      Cryotherapy Aftercare  Wash gently with soap and water everyday.   Apply Vaseline and Band-Aid daily until healed.      Due to recent changes in healthcare laws, you may see results of your pathology and/or laboratory studies on MyChart before the doctors have had a chance to review them. We understand that in some cases there may be results that are confusing or concerning to you. Please understand that not all results are received at the same time and often the doctors may need to interpret multiple results in order to provide you with the best plan of care or course of treatment. Therefore, we ask that  you please give us 2 business days to thoroughly review all your results before contacting the office for clarification. Should we see a critical lab result, you will be contacted sooner.   If You Need Anything After Your Visit  If you have any questions or concerns for your doctor, please call our main line at [phone] and press option 4 to reach your doctor's medical assistant. If no one answers, please leave a voicemail as directed and we will return your call as soon as possible. Messages left after 4 pm will be answered the following business day.   You may also send us a message via MyChart. We typically respond to MyChart messages within 1-2 business days.  For prescription refills, please ask your pharmacy to contact our office. Our fax number is [phone].  If you have an urgent issue when the clinic is closed that cannot wait until the next business day, you can page your doctor at the number below.    Please note that while we do our best to be available for urgent issues outside of office hours, we are not available 24/7.   If you have an urgent issue and are unable to reach us, you may choose to seek medical care at your doctor's office, retail clinic, urgent care center, or emergency room.  If you have a medical emergency, please immediately call 911 or go to the emergency department.  Pager Numbers  - Dr.   Kowalski: [phone]  - Dr. Moye: [phone]  - Dr. Stewart: [phone]  In the event of inclement weather, please call our main line at [phone] for an update on the status of any delays or closures.  Dermatology Medication Tips: Please keep the boxes that topical medications come in in order to help keep track of the instructions about where and how to use these. Pharmacies typically print the medication instructions only on the boxes and not directly on the medication tubes.   If your medication is too expensive, please contact our office at  [phone] option 4 or send us a message through MyChart.   We are unable to tell what your co-pay for medications will be in advance as this is different depending on your insurance coverage. However, we may be able to find a substitute medication at lower cost or fill out paperwork to get insurance to cover a needed medication.   If a prior authorization is required to get your medication covered by your insurance company, please allow us 1-2 business days to complete this process.  Drug prices often vary depending on where the prescription is filled and some pharmacies may offer cheaper prices.  The website www.goodrx.com contains coupons for medications through different pharmacies. The prices here do not account for what the cost may be with help from insurance (it may be cheaper with your insurance), but the website can give you the price if you did not use any insurance.  - You can print the associated coupon and take it with your prescription to the pharmacy.  - You may also stop by our office during regular business hours and pick up a GoodRx coupon card.  - If you need your prescription sent electronically to a different pharmacy, notify our office through [facility] MyChart or by phone at [phone] option 4.     Si Usted Necesita Algo Despus de Su Visita  Tambin puede enviarnos un mensaje a travs de MyChart. Por lo general respondemos a los mensajes de MyChart en el transcurso de 1 a 2 das hbiles.  Para renovar recetas, por favor pida a su farmacia que se ponga en contacto con nuestra oficina. Nuestro nmero de fax es el [phone].  Si tiene un asunto urgente cuando la clnica est cerrada y que no puede esperar hasta el siguiente da hbil, puede llamar/localizar a su doctor(a) al nmero que aparece a continuacin.   Por favor, tenga en cuenta que aunque hacemos todo lo posible para estar disponibles para asuntos urgentes fuera del horario de oficina, no estamos  disponibles las 24 horas del da, los 7 das de la semana.   Si tiene un problema urgente y no puede comunicarse con nosotros, puede optar por buscar atencin mdica  en el consultorio de su doctor(a), en una clnica privada, en un centro de atencin urgente o en una sala de emergencias.  Si tiene una emergencia mdica, por favor llame inmediatamente al 911 o vaya a la sala de emergencias.  Nmeros de bper  - Dr. Kowalski: [phone]  - Dra. Moye: [phone]  - Dra. Stewart: [phone]  En caso de inclemencias del tiempo, por favor llame a nuestra lnea principal al [phone] para una actualizacin sobre el estado de cualquier retraso o cierre.  Consejos para la medicacin en dermatologa: Por favor, guarde las cajas en las que vienen los medicamentos de uso tpico para ayudarle a seguir las instrucciones sobre dnde y cmo usarlos. Las farmacias generalmente imprimen las instrucciones del medicamento slo   en las cajas y no directamente en los tubos del medicamento.   Si su medicamento es muy caro, por favor, pngase en contacto con nuestra oficina llamando al [phone] y presione la opcin 4 o envenos un mensaje a travs de MyChart.   No podemos decirle cul ser su copago por los medicamentos por adelantado ya que esto es diferente dependiendo de la cobertura de su seguro. Sin embargo, es posible que podamos encontrar un medicamento sustituto a menor costo o llenar un formulario para que el seguro cubra el medicamento que se considera necesario.   Si se requiere una autorizacin previa para que su compaa de seguros cubra su medicamento, por favor permtanos de 1 a 2 das hbiles para completar este proceso.  Los precios de los medicamentos varan con frecuencia dependiendo del lugar de dnde se surte la receta y alguna farmacias pueden ofrecer precios ms baratos.  El sitio web www.goodrx.com tiene cupones para medicamentos de diferentes farmacias. Los precios aqu no  tienen en cuenta lo que podra costar con la ayuda del seguro (puede ser ms barato con su seguro), pero el sitio web puede darle el precio si no utiliz ningn seguro.  - Puede imprimir el cupn correspondiente y llevarlo con su receta a la farmacia.  - Tambin puede pasar por nuestra oficina durante el horario de atencin regular y recoger una tarjeta de cupones de GoodRx.  - Si necesita que su receta se enve electrnicamente a una farmacia diferente, informe a nuestra oficina a travs de MyChart de [facility] o por telfono llamando al [phone] y presione la opcin 4.  

## 2022-11-20 ENCOUNTER — Telehealth: Payer: Self-pay

## 2022-11-20 NOTE — Telephone Encounter (Signed)
-----   Message from Deirdre Evener, MD sent at 11/19/2022  6:32 PM EDT ----- Diagnosis Skin , left zygoma ant/sup to previous site BASAL CELL CARCINOMA, NODULAR AND INFILTRATIVE PATTERNS, ULCERATED  Cancer = BCC Schedule for Mohs

## 2022-11-20 NOTE — Telephone Encounter (Signed)
Left voicemail to return my call

## 2022-11-21 ENCOUNTER — Encounter: Payer: Self-pay | Admitting: Dermatology

## 2022-11-22 ENCOUNTER — Telehealth: Payer: Self-pay

## 2022-11-22 ENCOUNTER — Other Ambulatory Visit: Payer: Self-pay | Admitting: Dermatology

## 2022-11-22 DIAGNOSIS — C44310 Basal cell carcinoma of skin of unspecified parts of face: Secondary | ICD-10-CM

## 2022-11-22 DIAGNOSIS — B351 Tinea unguium: Secondary | ICD-10-CM

## 2022-11-22 NOTE — Telephone Encounter (Signed)
-----   Message from David C Kowalski, MD sent at 11/19/2022  6:32 PM EDT ----- Diagnosis Skin , left zygoma ant/sup to previous site BASAL CELL CARCINOMA, NODULAR AND INFILTRATIVE PATTERNS, ULCERATED  Cancer = BCC Schedule for Mohs 

## 2022-11-22 NOTE — Telephone Encounter (Signed)
Patient advised of BX results and referral to [facility] sent. aw

## 2022-11-29 ENCOUNTER — Other Ambulatory Visit: Payer: Self-pay | Admitting: Physician Assistant

## 2022-11-29 DIAGNOSIS — E7800 Pure hypercholesterolemia, unspecified: Secondary | ICD-10-CM

## 2022-12-07 ENCOUNTER — Other Ambulatory Visit: Payer: Self-pay | Admitting: Physician Assistant

## 2022-12-09 ENCOUNTER — Other Ambulatory Visit: Payer: Self-pay | Admitting: Physician Assistant

## 2022-12-09 DIAGNOSIS — I10 Essential (primary) hypertension: Secondary | ICD-10-CM

## 2023-01-04 ENCOUNTER — Other Ambulatory Visit: Payer: Self-pay | Admitting: Physician Assistant

## 2023-01-04 DIAGNOSIS — G4700 Insomnia, unspecified: Secondary | ICD-10-CM

## 2023-02-02 ENCOUNTER — Other Ambulatory Visit: Payer: Self-pay | Admitting: Physician Assistant

## 2023-02-07 DIAGNOSIS — M170 Bilateral primary osteoarthritis of knee: Secondary | ICD-10-CM | POA: Diagnosis not present

## 2023-02-15 DIAGNOSIS — M170 Bilateral primary osteoarthritis of knee: Secondary | ICD-10-CM | POA: Diagnosis not present

## 2023-02-22 DIAGNOSIS — M170 Bilateral primary osteoarthritis of knee: Secondary | ICD-10-CM | POA: Diagnosis not present

## 2023-03-14 ENCOUNTER — Other Ambulatory Visit: Payer: Self-pay | Admitting: Physician Assistant

## 2023-03-14 ENCOUNTER — Other Ambulatory Visit: Payer: Self-pay | Admitting: Family Medicine

## 2023-03-14 DIAGNOSIS — I10 Essential (primary) hypertension: Secondary | ICD-10-CM

## 2023-03-15 NOTE — Telephone Encounter (Signed)
Request was refilled 03/14/23 for 30 days.  Requested Prescriptions  Pending Prescriptions Disp Refills   amLODipine-benazepril (LOTREL) 10-40 MG capsule [Pharmacy Med Name: AMLODIPINE-BENAZEPRIL 10-40 MG CAP] 30 capsule 0    Sig: TAKE 1 CAPSULE BY MOUTH EVERY DAY     Cardiovascular: CCB + ACEI Combos Failed - 03/14/2023 11:44 AM      Failed - Cr in normal range and within 180 days    Creat  Date Value Ref Range Status  05/01/2017 0.89 0.70 - 1.25 mg/dL Final    Comment:    For patients >71 years of age, the reference limit for Creatinine is approximately 13% higher for people identified as African-American. .    Creatinine, Ser  Date Value Ref Range Status  11/21/2021 1.07 0.76 - 1.27 mg/dL Final         Failed - K in normal range and within 180 days    Potassium  Date Value Ref Range Status  11/21/2021 4.5 3.5 - 5.2 mmol/L Final         Failed - Na in normal range and within 180 days    Sodium  Date Value Ref Range Status  11/21/2021 140 134 - 144 mmol/L Final         Failed - eGFR is 30 or above and within 180 days    GFR calc Af Amer  Date Value Ref Range Status  05/13/2020 95 >59 mL/min/1.73 Final    Comment:    **[facility] currently reports eGFR in compliance with the current**   recommendations of the [employer]. [facility] will   update reporting as new guidelines are published from the NKF-ASN   Task force.    GFR calc non Af Amer  Date Value Ref Range Status  05/13/2020 82 >59 mL/min/1.73 Final   eGFR  Date Value Ref Range Status  11/21/2021 75 >59 mL/min/1.73 Final         Failed - Valid encounter within last 6 months    Recent Outpatient Visits           9 months ago Essential hypertension   [place] [facility] Alfredia Ferguson, PA-C   1 year ago Essential hypertension   [place] [facility] Bosie Clos, MD   1 year ago Essential hypertension   [place] [facility]  Bosie Clos, MD   2 years ago Annual physical exam   [facility] Bosie Clos, MD   3 years ago Annual physical exam   [facility] Bosie Clos, MD       Future Appointments             In 1 month Deirdre Evener, MD [facility]            Passed - Patient is not pregnant      Passed - Last BP in normal range    BP Readings from Last 1 Encounters:  11/15/22 108/62

## 2023-03-29 DIAGNOSIS — E7800 Pure hypercholesterolemia, unspecified: Secondary | ICD-10-CM | POA: Diagnosis not present

## 2023-03-29 DIAGNOSIS — M170 Bilateral primary osteoarthritis of knee: Secondary | ICD-10-CM | POA: Diagnosis not present

## 2023-03-29 DIAGNOSIS — M1A49X0 Other secondary chronic gout, multiple sites, without tophus (tophi): Secondary | ICD-10-CM | POA: Diagnosis not present

## 2023-03-29 DIAGNOSIS — I10 Essential (primary) hypertension: Secondary | ICD-10-CM | POA: Diagnosis not present

## 2023-04-02 ENCOUNTER — Telehealth: Payer: Self-pay

## 2023-04-02 NOTE — Telephone Encounter (Signed)
Patient called asking for hx of skin cancers for Dr. Patsey [facility]thold [facility] appt. aw

## 2023-04-11 DIAGNOSIS — C44319 Basal cell carcinoma of skin of other parts of face: Secondary | ICD-10-CM | POA: Diagnosis not present

## 2023-04-12 ENCOUNTER — Other Ambulatory Visit: Payer: Self-pay | Admitting: Family Medicine

## 2023-04-12 DIAGNOSIS — I10 Essential (primary) hypertension: Secondary | ICD-10-CM

## 2023-04-13 ENCOUNTER — Other Ambulatory Visit: Payer: Self-pay | Admitting: Family Medicine

## 2023-04-13 DIAGNOSIS — I10 Essential (primary) hypertension: Secondary | ICD-10-CM

## 2023-04-24 ENCOUNTER — Encounter: Payer: Self-pay | Admitting: Dermatology

## 2023-04-24 ENCOUNTER — Ambulatory Visit: Payer: BC Managed Care – PPO | Admitting: Dermatology

## 2023-04-24 VITALS — BP 147/81

## 2023-04-24 DIAGNOSIS — B351 Tinea unguium: Secondary | ICD-10-CM

## 2023-04-24 DIAGNOSIS — W908XXA Exposure to other nonionizing radiation, initial encounter: Secondary | ICD-10-CM | POA: Diagnosis not present

## 2023-04-24 DIAGNOSIS — Z85828 Personal history of other malignant neoplasm of skin: Secondary | ICD-10-CM | POA: Diagnosis not present

## 2023-04-24 DIAGNOSIS — L578 Other skin changes due to chronic exposure to nonionizing radiation: Secondary | ICD-10-CM

## 2023-04-24 DIAGNOSIS — Z7189 Other specified counseling: Secondary | ICD-10-CM

## 2023-04-24 DIAGNOSIS — Z79899 Other long term (current) drug therapy: Secondary | ICD-10-CM

## 2023-04-24 DIAGNOSIS — Z872 Personal history of diseases of the skin and subcutaneous tissue: Secondary | ICD-10-CM

## 2023-04-24 DIAGNOSIS — B353 Tinea pedis: Secondary | ICD-10-CM

## 2023-04-24 MED ORDER — TERBINAFINE HCL 250 MG PO TABS: 250.0000 mg | ORAL_TABLET | Freq: Every day | ORAL | 0 refills | Status: AC

## 2023-04-24 NOTE — Patient Instructions (Signed)

## 2023-04-24 NOTE — Progress Notes (Signed)
   Follow-Up Visit   Subjective  Wayne Goodwin is a 72 y.o. male who presents for the following: 6 month follow up of onychomycosis - Lamisil x 3 months, [place] follow up of left cheek treated with LN2, Biopsy follow up - [facility] of left zygoma treated with Mohs 04/18/2023 with Dr Adriana Simas  The patient has spots, moles and lesions to be evaluated, some may be new or changing and the patient may have concern these could be cancer.   The following portions of the chart were reviewed this encounter and updated as appropriate: medications, allergies, medical history  Review of Systems:  No other skin or systemic complaints except as noted in HPI or Assessment and Plan.  Objective  Well appearing patient in no apparent distress; mood and affect are within normal limits.  A focused examination was performed of the following areas: Face, feet/toenails   Relevant exam findings are noted in the Assessment and Plan.    Assessment & Plan   HISTORY OF BASAL CELL CARCINOMA OF THE SKIN - Left Zygoma - S/P Mohs 04/2023 - Healing Mohs excision site - No evidence of recurrence today - Recommend regular full body skin exams - Recommend daily broad spectrum sunscreen SPF 30+ to sun-exposed areas, reapply every 2 hours as needed.  - Call if any new or changing lesions are noted between office visits  HISTORY OF PRECANCEROUS ACTINIC KERATOSIS - site(s) of PreCancerous Actinic Keratosis clear today. - these may recur and new lesions may form requiring treatment to prevent transformation into skin cancer - observe for new or changing spots and contact [facility] for appointment if occur - photoprotection with sun protective clothing; sunglasses and broad spectrum sunscreen with SPF of at least 30 + and frequent self skin exams recommended - yearly exams by a dermatologist recommended for persons with history of PreCancerous Actinic Keratoses  ONYCHOMYCOSIS Exam: Some yellowing and thickening og bilateral  great toenails Chronic and persistent condition with duration or expected duration over one year. Condition is improving with treatment but not currently at goal.  Treatment Plan: Restart Terbinafine 250 mg 1 po every day x one more month  ACTINIC DAMAGE - chronic, secondary to cumulative UV radiation exposure/sun exposure over time - diffuse scaly erythematous macules with underlying dyspigmentation - Recommend daily broad spectrum sunscreen SPF 30+ to sun-exposed areas, reapply every 2 hours as needed.  - Recommend staying in the shade or wearing long sleeves, sun glasses (UVA+UVB protection) and wide brim hats (4-inch brim around the entire circumference of the hat). - Call for new or changing lesions.   Onychomycosis  Related Medications terbinafine (LAMISIL) 250 MG tablet Take 1 tablet (250 mg total) by mouth daily.    Return in about 6 months (around 10/22/2023) for TBSE.  I, Joanie Coddington, CMA, am acting as scribe for Armida Sans, MD .   Documentation: I have reviewed the above documentation for accuracy and completeness, and I agree with the above.  Armida Sans, MD

## 2023-04-30 ENCOUNTER — Telehealth: Payer: Self-pay

## 2023-04-30 NOTE — Telephone Encounter (Signed)
Specimen tracking and history updated from [facility] progress notes and photos of Left Cheek, 04/11/2023. aw

## 2023-05-29 ENCOUNTER — Other Ambulatory Visit: Payer: Self-pay | Admitting: Dermatology

## 2023-06-19 ENCOUNTER — Encounter (INDEPENDENT_AMBULATORY_CARE_PROVIDER_SITE_OTHER): Payer: BC Managed Care – PPO | Admitting: Ophthalmology

## 2023-06-19 DIAGNOSIS — H43813 Vitreous degeneration, bilateral: Secondary | ICD-10-CM | POA: Diagnosis not present

## 2023-06-19 DIAGNOSIS — H35033 Hypertensive retinopathy, bilateral: Secondary | ICD-10-CM

## 2023-06-19 DIAGNOSIS — I10 Essential (primary) hypertension: Secondary | ICD-10-CM

## 2023-06-19 DIAGNOSIS — H35373 Puckering of macula, bilateral: Secondary | ICD-10-CM

## 2023-06-19 DIAGNOSIS — H338 Other retinal detachments: Secondary | ICD-10-CM | POA: Diagnosis not present

## 2023-07-23 DIAGNOSIS — M25561 Pain in right knee: Secondary | ICD-10-CM | POA: Diagnosis not present

## 2023-07-23 DIAGNOSIS — M25562 Pain in left knee: Secondary | ICD-10-CM | POA: Diagnosis not present

## 2023-09-05 ENCOUNTER — Other Ambulatory Visit: Payer: Self-pay | Admitting: Physician Assistant

## 2023-09-05 DIAGNOSIS — E7800 Pure hypercholesterolemia, unspecified: Secondary | ICD-10-CM

## 2023-11-06 ENCOUNTER — Ambulatory Visit: Payer: BC Managed Care – PPO | Admitting: Dermatology

## 2023-11-20 ENCOUNTER — Ambulatory Visit: Admitting: Dermatology

## 2023-12-11 ENCOUNTER — Encounter: Payer: Self-pay | Admitting: Dermatology

## 2023-12-11 ENCOUNTER — Ambulatory Visit: Admitting: Dermatology

## 2023-12-11 DIAGNOSIS — W908XXA Exposure to other nonionizing radiation, initial encounter: Secondary | ICD-10-CM

## 2023-12-11 DIAGNOSIS — L570 Actinic keratosis: Secondary | ICD-10-CM

## 2023-12-11 DIAGNOSIS — C4371 Malignant melanoma of right lower limb, including hip: Secondary | ICD-10-CM

## 2023-12-11 DIAGNOSIS — L817 Pigmented purpuric dermatosis: Secondary | ICD-10-CM

## 2023-12-11 DIAGNOSIS — C439 Malignant melanoma of skin, unspecified: Secondary | ICD-10-CM

## 2023-12-11 DIAGNOSIS — L820 Inflamed seborrheic keratosis: Secondary | ICD-10-CM | POA: Diagnosis not present

## 2023-12-11 DIAGNOSIS — D1801 Hemangioma of skin and subcutaneous tissue: Secondary | ICD-10-CM

## 2023-12-11 DIAGNOSIS — D2272 Melanocytic nevi of left lower limb, including hip: Secondary | ICD-10-CM | POA: Diagnosis not present

## 2023-12-11 DIAGNOSIS — L821 Other seborrheic keratosis: Secondary | ICD-10-CM

## 2023-12-11 DIAGNOSIS — Z1283 Encounter for screening for malignant neoplasm of skin: Secondary | ICD-10-CM

## 2023-12-11 DIAGNOSIS — I872 Venous insufficiency (chronic) (peripheral): Secondary | ICD-10-CM

## 2023-12-11 DIAGNOSIS — L814 Other melanin hyperpigmentation: Secondary | ICD-10-CM

## 2023-12-11 DIAGNOSIS — L578 Other skin changes due to chronic exposure to nonionizing radiation: Secondary | ICD-10-CM

## 2023-12-11 DIAGNOSIS — D492 Neoplasm of unspecified behavior of bone, soft tissue, and skin: Secondary | ICD-10-CM

## 2023-12-11 DIAGNOSIS — D239 Other benign neoplasm of skin, unspecified: Secondary | ICD-10-CM

## 2023-12-11 DIAGNOSIS — D229 Melanocytic nevi, unspecified: Secondary | ICD-10-CM

## 2023-12-11 DIAGNOSIS — Z85828 Personal history of other malignant neoplasm of skin: Secondary | ICD-10-CM

## 2023-12-11 HISTORY — DX: Malignant melanoma of skin, unspecified: C43.9

## 2023-12-11 HISTORY — DX: Other benign neoplasm of skin, unspecified: D23.9

## 2023-12-11 NOTE — Patient Instructions (Addendum)

## 2023-12-11 NOTE — Progress Notes (Signed)
 Follow-Up Visit   Subjective  Wayne Goodwin is a 73 y.o. male who presents for the following: Skin Cancer Screening and Full Body Skin Exam, hx of BCC  The patient presents for Total-Body Skin Exam (TBSE) for skin cancer screening and mole check. The patient has spots, moles and lesions to be evaluated, some may be new or changing and the patient may have concern these could be cancer.  The following portions of the chart were reviewed this encounter and updated as appropriate: medications, allergies, medical history  Review of Systems:  No other skin or systemic complaints except as noted in HPI or Assessment and Plan.  Objective  Well appearing patient in no apparent distress; mood and affect are within normal limits.  A full examination was performed including scalp, head, eyes, ears, nose, lips, neck, chest, axillae, abdomen, back, buttocks, bilateral upper extremities, bilateral lower extremities, hands, feet, fingers, toes, fingernails, and toenails. All findings within normal limits unless otherwise noted below.   Relevant physical exam findings are noted in the Assessment and Plan.  face x 12 (12) Erythematous thin papules/macules with gritty scale.  right calf x 1 Stuck-on, waxy, tan-brown papule or plaque --Discussed benign etiology and prognosis.  left middle to proximal medial calf 1.1 cm irregular pink papule   right proximal medial calf 1.2 cm irregular pink papule    Assessment & Plan   SKIN CANCER SCREENING PERFORMED TODAY.  LENTIGINES, SEBORRHEIC KERATOSES, HEMANGIOMAS - Benign normal skin lesions - Benign-appearing - Call for any changes  MELANOCYTIC NEVI - Tan-brown and/or pink-flesh-colored symmetric macules and papules - Benign appearing on exam today - Observation - Call clinic for new or changing moles - Recommend daily use of broad spectrum spf 30+ sunscreen to sun-exposed areas.   HISTORY OF BASAL CELL CARCINOMA OF THE SKIN - Left Zygoma - S/P  Mohs 04/2023 - Healing Mohs excision site - No evidence of recurrence today - Recommend regular full body skin exams - Recommend daily broad spectrum sunscreen SPF 30+ to sun-exposed areas, reapply every 2 hours as needed.  - Call if any new or changing lesions are noted between office visits  Stasis dermatitis of both legs b/l lower legs With schaumbergs purpura  Stasis in the legs causes chronic leg swelling, which may result in itchy or painful rashes, skin discoloration, skin texture changes, and sometimes ulceration.  Recommend daily graduated compression hose/stockings- easiest to put on first thing in morning, remove at bedtime.  Elevate legs as much as possible. Avoid salt/sodium rich foods.  AK (ACTINIC KERATOSIS) (12) face x 12 (12) ACTINIC DAMAGE - chronic, secondary to cumulative UV radiation exposure/sun exposure over time - diffuse scaly erythematous macules with underlying dyspigmentation - Recommend daily broad spectrum sunscreen SPF 30+ to sun-exposed areas, reapply every 2 hours as needed.  - Recommend staying in the shade or wearing long sleeves, sun glasses (UVA+UVB protection) and wide brim hats (4-inch brim around the entire circumference of the hat). - Call for new or changing lesions.  Destruction of lesion - face x 12 (12) Complexity: simple   Destruction method: cryotherapy   Informed consent: discussed and consent obtained   Timeout:  patient name, date of birth, surgical site, and procedure verified Lesion destroyed using liquid nitrogen: Yes   Region frozen until ice ball extended beyond lesion: Yes   Outcome: patient tolerated procedure well with no complications   Post-procedure details: wound care instructions given   INFLAMED SEBORRHEIC KERATOSIS right calf x 1 Symptomatic,  irritating, patient would like treated.  Destruction of lesion - right calf x 1 Complexity: simple   Destruction method: cryotherapy   Informed consent: discussed and consent  obtained   Timeout:  patient name, date of birth, surgical site, and procedure verified Lesion destroyed using liquid nitrogen: Yes   Region frozen until ice ball extended beyond lesion: Yes   Outcome: patient tolerated procedure well with no complications   Post-procedure details: wound care instructions given   NEOPLASM OF SKIN (2) left middle to proximal medial calf Epidermal / dermal shaving  Lesion diameter (cm):  1.1 Informed consent: discussed and consent obtained   Timeout: patient name, date of birth, surgical site, and procedure verified   Procedure prep:  Patient was prepped and draped in usual sterile fashion Prep type:  Isopropyl alcohol Anesthesia: the lesion was anesthetized in a standard fashion   Anesthetic:  1% lidocaine  w/ epinephrine  1-100,000 buffered w/ 8.4% NaHCO3 Hemostasis achieved with: pressure, aluminum chloride and electrodesiccation   Outcome: patient tolerated procedure well   Post-procedure details: sterile dressing applied and wound care instructions given   Dressing type: bandage and petrolatum   Specimen 1 - Surgical pathology Differential Diagnosis: dermatofibroma vs nevus R/O Dysplastic nevus   Check Margins: No right proximal medial calf Epidermal / dermal shaving  Lesion diameter (cm):  1.2 Informed consent: discussed and consent obtained   Timeout: patient name, date of birth, surgical site, and procedure verified   Procedure prep:  Patient was prepped and draped in usual sterile fashion Prep type:  Isopropyl alcohol Anesthesia: the lesion was anesthetized in a standard fashion   Anesthetic:  1% lidocaine  w/ epinephrine  1-100,000 buffered w/ 8.4% NaHCO3 Hemostasis achieved with: pressure, aluminum chloride and electrodesiccation   Outcome: patient tolerated procedure well   Post-procedure details: sterile dressing applied and wound care instructions given   Dressing type: bandage and petrolatum   Specimen 2 - Surgical  pathology Differential Diagnosis: dermatofibroma vs nevus R/O Dysplastic nevus   Check Margins: No    Return in about 1 year (around 12/10/2024) for TBSE, hx of BCC.  IClara Crisp, CMA, am acting as scribe for Celine Collard, MD .   Documentation: I have reviewed the above documentation for accuracy and completeness, and I agree with the above.  Celine Collard, MD

## 2023-12-18 LAB — SURGICAL PATHOLOGY

## 2023-12-19 ENCOUNTER — Ambulatory Visit: Payer: Self-pay | Admitting: Dermatology

## 2023-12-20 ENCOUNTER — Other Ambulatory Visit: Payer: Self-pay

## 2023-12-20 ENCOUNTER — Telehealth: Payer: Self-pay

## 2023-12-20 DIAGNOSIS — C439 Malignant melanoma of skin, unspecified: Secondary | ICD-10-CM

## 2023-12-20 NOTE — Telephone Encounter (Signed)
 LM on VM please return to let him know we have him scheduled here for a 3 months TBSE August 19 at 9 am    right proximal medial calf :      MALIGNANT MELANOMA

## 2023-12-25 ENCOUNTER — Telehealth: Payer: Self-pay

## 2023-12-25 NOTE — Telephone Encounter (Signed)
 Referral placed and sent to Dr. Dorna Gasman 12/20/2023 regarding invasive malignant Melanoma at right proximal medial calf.

## 2023-12-25 NOTE — Telephone Encounter (Signed)
 Mohs referral is scheduled with Dr Phylliss Brenner at [facility] Dec 27 2023 at [facility] testing was sent on this patient 12/13/2023

## 2024-01-01 ENCOUNTER — Telehealth: Payer: Self-pay

## 2024-01-01 NOTE — Telephone Encounter (Signed)
 Patty called and stated that the patient was seen by Dr. Dorna Gasman on 12/27/23, but the actual location of the biopsy is questionable. Patient states that melanoma site is the R lat calf, but pathology states R prox med calf. Dr. Dorna Gasman stated that he couldn't tell which one it is from the photos. I reviewed chart and photo and discussed with Patty that it is the biopsy site near the popliteal area closest to the body. I wanted to confirm with you. I also forwarded you a photo from Dr. Robinette Chou office if you could review that and confirm site as well. Thank you.

## 2024-01-01 NOTE — Telephone Encounter (Signed)
 Responded to email from Markus Sill at [facility]. Awaiting her response to see if she would like patient to come into office for updated photo.

## 2024-01-03 NOTE — Telephone Encounter (Signed)
 Spoke with Wayne Goodwin from Dr. Robinette Chou office. Wayne Goodwin would like new photos with patient standing and BX site marked. Dr. Bary Likes states he MUST be in the office to have this done. I have called pt and left a message to return my call and offer patient appt Monday afternoon. Aw

## 2024-01-03 NOTE — Telephone Encounter (Signed)
 I spoke with patient and he is coming 01/07/24 at 4:30 pm.

## 2024-01-03 NOTE — Telephone Encounter (Signed)
 Left voicemail for patient to return my call.

## 2024-01-07 ENCOUNTER — Encounter: Payer: Self-pay | Admitting: Dermatology

## 2024-01-07 ENCOUNTER — Ambulatory Visit (INDEPENDENT_AMBULATORY_CARE_PROVIDER_SITE_OTHER): Admitting: Dermatology

## 2024-01-07 DIAGNOSIS — C4371 Malignant melanoma of right lower limb, including hip: Secondary | ICD-10-CM | POA: Diagnosis not present

## 2024-01-07 DIAGNOSIS — Z872 Personal history of diseases of the skin and subcutaneous tissue: Secondary | ICD-10-CM

## 2024-01-07 DIAGNOSIS — D2372 Other benign neoplasm of skin of left lower limb, including hip: Secondary | ICD-10-CM | POA: Diagnosis not present

## 2024-01-07 DIAGNOSIS — Z7189 Other specified counseling: Secondary | ICD-10-CM

## 2024-01-07 DIAGNOSIS — L820 Inflamed seborrheic keratosis: Secondary | ICD-10-CM

## 2024-01-07 DIAGNOSIS — Z86018 Personal history of other benign neoplasm: Secondary | ICD-10-CM

## 2024-01-07 NOTE — Patient Instructions (Signed)

## 2024-01-07 NOTE — Progress Notes (Signed)
   Follow-Up Visit   Subjective  Wayne Goodwin is a 73 y.o. male who presents for the following: photographing of MM site for Dr. Dorna Gasman. Surgery is scheduled for tomorrow.   The following portions of the chart were reviewed this encounter and updated as appropriate: medications, allergies, medical history  Review of Systems:  No other skin or systemic complaints except as noted in HPI or Assessment and Plan.  Objective  Well appearing patient in no apparent distress; mood and affect are within normal limits.  A focused examination was performed of the following areas: Legs   Relevant physical exam findings are noted in the Assessment and Plan.  right proximal medial calf Eschar at biopsy site. Bx proven melanoma    Assessment & Plan   MELANOMA MALIGNANT MELANOMA OF SKIN OF LEG, RIGHT (HCC) right proximal medial calf See photos. Pt scheduled for Lymphoscintigraphy and Sentinel Lymph node biopsy and Wide local excision tomorrow 01/08/2024 with Dr Dorna Gasman = [facility].  Mild dysplastic Left middle to proximal medial calf Recheck next visit  History of Inflamed Seborrheic Keratosis Right proximal lateral calf Resolved  See all Marked Photos above in OBJECTIVE / Exam section  Return for TBSE As Scheduled, With Dr. Bary Likes.  I, Jill Parcell, CMA, am acting as scribe for Celine Collard, MD.   Documentation: I have reviewed the above documentation for accuracy and completeness, and I agree with the above.  Celine Collard, MD

## 2024-01-10 ENCOUNTER — Telehealth: Payer: Self-pay

## 2024-01-10 NOTE — Telephone Encounter (Signed)
 Left voicemail to return my call. [facility] testing came back as Class 1A, lowest risk of melanoma metastasis within 5 years. Results labeled to be scanned into chart.

## 2024-01-10 NOTE — Telephone Encounter (Signed)
 Patient returned Ashley's phone call. Called patient back on work and cell, no answer. aw

## 2024-01-15 NOTE — Telephone Encounter (Signed)
 Patient advised of [facility] Test results. aw

## 2024-01-23 ENCOUNTER — Encounter: Payer: Self-pay | Admitting: Dermatology

## 2024-01-23 ENCOUNTER — Ambulatory Visit: Payer: Self-pay | Admitting: Dermatology

## 2024-01-23 NOTE — Telephone Encounter (Addendum)
 Tried calling patient regarding Public affairs consultant. No answer. LM for patient to return call.   ----- Message from Celine Collard sent at 01/23/2024  5:01 PM EDT ----- Arta Bihari testing on Melanoma tissue from original biopsy showed: Class IA = Lowest risk of metastasis within the 1st 5 years.  Please advise pt of good info (may have already gotten report info in past) ----- Message ----- From: Harlen Lick Sent: 01/23/2024   1:32 PM EDT To: Elta Halter, MD

## 2024-01-24 NOTE — Telephone Encounter (Signed)
-----   Message from Celine Collard sent at 01/23/2024  5:01 PM EDT ----- Arta Bihari testing on Melanoma tissue from original biopsy showed: Class IA = Lowest risk of metastasis within the 1st 5 years.  Please advise pt of good info (may have already gotten report info in past) ----- Message ----- From: Harlen Lick Sent: 01/23/2024   1:32 PM EDT To: Elta Halter, MD

## 2024-01-24 NOTE — Telephone Encounter (Signed)
 Pt informed of results.

## 2024-03-25 ENCOUNTER — Ambulatory Visit: Admitting: Dermatology

## 2024-06-10 ENCOUNTER — Encounter: Payer: Self-pay | Admitting: Dermatology

## 2024-06-10 ENCOUNTER — Ambulatory Visit: Admitting: Dermatology

## 2024-06-10 DIAGNOSIS — L820 Inflamed seborrheic keratosis: Secondary | ICD-10-CM

## 2024-06-10 DIAGNOSIS — L814 Other melanin hyperpigmentation: Secondary | ICD-10-CM

## 2024-06-10 DIAGNOSIS — D2271 Melanocytic nevi of right lower limb, including hip: Secondary | ICD-10-CM | POA: Diagnosis not present

## 2024-06-10 DIAGNOSIS — L918 Other hypertrophic disorders of the skin: Secondary | ICD-10-CM

## 2024-06-10 DIAGNOSIS — Z1283 Encounter for screening for malignant neoplasm of skin: Secondary | ICD-10-CM

## 2024-06-10 DIAGNOSIS — W908XXA Exposure to other nonionizing radiation, initial encounter: Secondary | ICD-10-CM

## 2024-06-10 DIAGNOSIS — L578 Other skin changes due to chronic exposure to nonionizing radiation: Secondary | ICD-10-CM

## 2024-06-10 DIAGNOSIS — D1801 Hemangioma of skin and subcutaneous tissue: Secondary | ICD-10-CM

## 2024-06-10 DIAGNOSIS — D489 Neoplasm of uncertain behavior, unspecified: Secondary | ICD-10-CM

## 2024-06-10 DIAGNOSIS — L570 Actinic keratosis: Secondary | ICD-10-CM

## 2024-06-10 DIAGNOSIS — Z85820 Personal history of malignant melanoma of skin: Secondary | ICD-10-CM

## 2024-06-10 DIAGNOSIS — Z7189 Other specified counseling: Secondary | ICD-10-CM

## 2024-06-10 DIAGNOSIS — D229 Melanocytic nevi, unspecified: Secondary | ICD-10-CM

## 2024-06-10 DIAGNOSIS — Z86018 Personal history of other benign neoplasm: Secondary | ICD-10-CM

## 2024-06-10 DIAGNOSIS — L821 Other seborrheic keratosis: Secondary | ICD-10-CM

## 2024-06-10 DIAGNOSIS — Z85828 Personal history of other malignant neoplasm of skin: Secondary | ICD-10-CM

## 2024-06-10 NOTE — Patient Instructions (Addendum)
 Biopsy Wound Care Instructions  Leave the original bandage on for 24 hours if possible.  If the bandage becomes soaked or soiled before that time, it is OK to remove it and examine the wound.  A small amount of post-operative bleeding is normal.  If excessive bleeding occurs, remove the bandage, place gauze over the site and apply continuous pressure (no peeking) over the area for 30 minutes. If this does not work, please call our clinic as soon as possible or page your doctor if it is after hours.   Once a day, cleanse the wound with soap and water. It is fine to shower. If a thick crust develops you may use a Q-tip dipped into dilute hydrogen peroxide (mix 1:1 with water) to dissolve it.  Hydrogen peroxide can slow the healing process, so use it only as needed.    After washing, apply petroleum jelly (Vaseline) or an antibiotic ointment if your doctor prescribed one for you, followed by a bandage.    For best healing, the wound should be covered with a layer of ointment at all times. If you are not able to keep the area covered with a bandage to hold the ointment in place, this may mean re-applying the ointment several times a day.  Continue this wound care until the wound has healed and is no longer open.   Itching and mild discomfort is normal during the healing process. However, if you develop pain or severe itching, please call our office.   If you have any discomfort, you can take Tylenol  (acetaminophen ) or ibuprofen as directed on the bottle. (Please do not take these if you have an allergy to them or cannot take them for another reason).  Some redness, tenderness and white or yellow material in the wound is normal healing.  If the area becomes very sore and red, or develops a thick yellow-green material (pus), it may be infected; please notify us .    If you have stitches, return to clinic as directed to have the stitches removed. You will continue wound care for 2-3 days after the stitches  are removed.   Wound healing continues for up to one year following surgery. It is not unusual to experience pain in the scar from time to time during the interval.  If the pain becomes severe or the scar thickens, you should notify the office.    A slight amount of redness in a scar is expected for the first six months.  After six months, the redness will fade and the scar will soften and fade.  The color difference becomes less noticeable with time.  If there are any problems, return for a post-op surgery check at your earliest convenience.  To improve the appearance of the scar, you can use silicone scar gel, cream, or sheets (such as Mederma or Serica) every night for up to one year. These are available over the counter (without a prescription).  Please call our office at [phone] for any questions or concerns.      Cryotherapy Aftercare  Wash gently with soap and water everyday.   Apply Vaseline and Band-Aid daily until healed.   Actinic keratoses are precancerous spots that appear secondary to cumulative UV radiation exposure/sun exposure over time. They are chronic with expected duration over 1 year. A portion of actinic keratoses will progress to squamous cell carcinoma of the skin. It is not possible to reliably predict which spots will progress to skin cancer and so treatment is recommended to  prevent development of skin cancer.  Recommend daily broad spectrum sunscreen SPF 30+ to sun-exposed areas, reapply every 2 hours as needed.  Recommend staying in the shade or wearing long sleeves, sun glasses (UVA+UVB protection) and wide brim hats (4-inch brim around the entire circumference of the hat). Call for new or changing lesions.    Seborrheic Keratosis  What causes seborrheic keratoses? Seborrheic keratoses are harmless, common skin growths that first appear during adult life.  As time goes by, more growths appear.  Some people may develop a large number of them.   Seborrheic keratoses appear on both covered and uncovered body parts.  They are not caused by sunlight.  The tendency to develop seborrheic keratoses can be inherited.  They vary in color from skin-colored to gray, brown, or even black.  They can be either smooth or have a rough, warty surface.   Seborrheic keratoses are superficial and look as if they were stuck on the skin.  Under the microscope this type of keratosis looks like layers upon layers of skin.  That is why at times the top layer may seem to fall off, but the rest of the growth remains and re-grows.    Treatment Seborrheic keratoses do not need to be treated, but can easily be removed in the office.  Seborrheic keratoses often cause symptoms when they rub on clothing or jewelry.  Lesions can be in the way of shaving.  If they become inflamed, they can cause itching, soreness, or burning.  Removal of a seborrheic keratosis can be accomplished by freezing, burning, or surgery. If any spot bleeds, scabs, or grows rapidly, please return to have it checked, as these can be an indication of a skin cancer.   Melanoma ABCDEs  Melanoma is the most dangerous type of skin cancer, and is the leading cause of death from skin disease.  You are more likely to develop melanoma if you: Have light-colored skin, light-colored eyes, or red or blond hair Spend a lot of time in the sun Tan regularly, either outdoors or in a tanning bed Have had blistering sunburns, especially during childhood Have a close family member who has had a melanoma Have atypical moles or large birthmarks  Early detection of melanoma is key since treatment is typically straightforward and cure rates are extremely high if we catch it early.   The first sign of melanoma is often a change in a mole or a new dark spot.  The ABCDE system is a way of remembering the signs of melanoma.  A for asymmetry:  The two halves do not match. B for border:  The edges of the growth are  irregular. C for color:  A mixture of colors are present instead of an even brown color. D for diameter:  Melanomas are usually (but not always) greater than 6mm - the size of a pencil eraser. E for evolution:  The spot keeps changing in size, shape, and color.  Please check your skin once per month between visits. You can use a small mirror in front and a large mirror behind you to keep an eye on the back side or your body.   If you see any new or changing lesions before your next follow-up, please call to schedule a visit.  Please continue daily skin protection including broad spectrum sunscreen SPF 30+ to sun-exposed areas, reapplying every 2 hours as needed when you're outdoors.   Staying in the shade or wearing long sleeves, sun glasses (UVA+UVB protection)  and wide brim hats (4-inch brim around the entire circumference of the hat) are also recommended for sun protection.     Due to recent changes in healthcare laws, you may see results of your pathology and/or laboratory studies on MyChart before the doctors have had a chance to review them. We understand that in some cases there may be results that are confusing or concerning to you. Please understand that not all results are received at the same time and often the doctors may need to interpret multiple results in order to provide you with the best plan of care or course of treatment. Therefore, we ask that you please give us  2 business days to thoroughly review all your results before contacting the office for clarification. Should we see a critical lab result, you will be contacted sooner.   If You Need Anything After Your Visit  If you have any questions or concerns for your doctor, please call our main line at [phone] and press option 4 to reach your doctor's medical assistant. If no one answers, please leave a voicemail as directed and we will return your call as soon as possible. Messages left after 4 pm will be answered the  following business day.   You may also send us  a message via MyChart. We typically respond to MyChart messages within 1-2 business days.  For prescription refills, please ask your pharmacy to contact our office. Our fax number is [phone].  If you have an urgent issue when the clinic is closed that cannot wait until the next business day, you can page your doctor at the number below.    Please note that while we do our best to be available for urgent issues outside of office hours, we are not available 24/7.   If you have an urgent issue and are unable to reach us , you may choose to seek medical care at your doctor's office, retail clinic, urgent care center, or emergency room.  If you have a medical emergency, please immediately call 911 or go to the emergency department.  Pager Numbers  - Dr. Hester: [phone]  - Dr. Jackquline: [phone]  - Dr. Claudene: [phone]   - Dr. Raymund: [phone]  In the event of inclement weather, please call our main line at [phone] for an update on the status of any delays or closures.  Dermatology Medication Tips: Please keep the boxes that topical medications come in in order to help keep track of the instructions about where and how to use these. Pharmacies typically print the medication instructions only on the boxes and not directly on the medication tubes.   If your medication is too expensive, please contact our office at [phone] option 4 or send us  a message through MyChart.   We are unable to tell what your co-pay for medications will be in advance as this is different depending on your insurance coverage. However, we may be able to find a substitute medication at lower cost or fill out paperwork to get insurance to cover a needed medication.   If a prior authorization is required to get your medication covered by your insurance company, please allow us  1-2 business days to complete this process.  Drug prices often vary  depending on where the prescription is filled and some pharmacies may offer cheaper prices.  The website www.goodrx.com contains coupons for medications through different pharmacies. The prices here do not account for what the cost may be with help from insurance (it may be cheaper  with your insurance), but the website can give you the price if you did not use any insurance.  - You can print the associated coupon and take it with your prescription to the pharmacy.  - You may also stop by our office during regular business hours and pick up a GoodRx coupon card.  - If you need your prescription sent electronically to a different pharmacy, notify our office through [facility] or by phone at [phone] option 4.     Si Usted Necesita Algo Despus de Su Visita  Tambin puede enviarnos un mensaje a travs de Clinical Cytogeneticist. Por lo general respondemos a los mensajes de MyChart en el transcurso de 1 a 2 das hbiles.  Para renovar recetas, por favor pida a su farmacia que se ponga en contacto con nuestra oficina. Randi lakes de fax es [place] [phone].  Si tiene un asunto urgente cuando la clnica est cerrada y que no puede esperar hasta el siguiente da hbil, puede llamar/localizar a su doctor(a) al nmero que aparece a continuacin.   Por favor, tenga en cuenta que aunque hacemos todo lo posible para estar disponibles para asuntos urgentes fuera del horario de [place], no estamos disponibles las 24 horas del da, los 7 [address]  [phone] de la [place].   Si tiene un problema urgente y no puede comunicarse con nosotros, puede optar por buscar atencin mdica  en el consultorio de su doctor(a), en una clnica privada, en un centro de atencin urgente o en una sala de emergencias.  Si tiene engineer, drilling, por favor llame inmediatamente al 911 o vaya a la sala de emergencias.  Nmeros de bper  - Dr. Hester: [phone]  - Dra. Jackquline: [phone]  - Dr. Claudene: [phone]  - Dra.  Kitts: [phone]  En caso de inclemencias del [place], por favor llame a nuestra lnea principal al [phone] para una actualizacin sobre el estado de cualquier retraso o cierre.  Consejos para la medicacin en dermatologa: Por favor, guarde las cajas en las que vienen los medicamentos de uso tpico para ayudarle a seguir las instrucciones sobre dnde y cmo usarlos. Las farmacias generalmente imprimen las instrucciones del medicamento slo en las cajas y no directamente en los tubos del [place].   Si su medicamento es muy caro, por favor, pngase en contacto con landry rieger llamando al [phone] y presione la opcin 4 o envenos un mensaje a travs de Clinical Cytogeneticist.   No podemos decirle cul ser su copago por los medicamentos por adelantado ya que esto es diferente dependiendo de la cobertura de su seguro. Sin embargo, es posible que podamos encontrar un medicamento sustituto a audiological scientist un formulario para que el seguro cubra el medicamento que se considera necesario.   Si se requiere una autorizacin previa para que su compaa de seguros cubra su medicamento, por favor permtanos de 1 a 2 das hbiles para completar este proceso.  Los precios de los medicamentos varan con frecuencia dependiendo del environmental consultant de dnde se surte la receta y alguna farmacias pueden ofrecer precios ms baratos.  El sitio web www.goodrx.com tiene cupones para medicamentos de health and safety inspector. Los precios aqu no tienen en cuenta lo que podra costar con la ayuda del seguro (puede ser ms barato con su seguro), pero el sitio web puede darle el precio si no utiliz tourist information centre manager.  - Puede imprimir el cupn correspondiente y llevarlo con su receta a la farmacia.  - Tambin puede pasar por nuestra oficina durante el horario de atencin  regular y recoger una tarjeta de cupones de GoodRx.  - Si necesita que su receta se enve electrnicamente a una farmacia diferente, informe a nuestra oficina a  travs de MyChart de [place] o por telfono llamando al [phone] y presione la opcin 4.

## 2024-06-10 NOTE — Progress Notes (Signed)
 Follow-Up Visit   Subjective  Wayne Goodwin is a 73 y.o. male who presents for the following: Skin Cancer Screening and Full Body Skin Exam Hx of malignant melanoma  Hx of bcc Hx of dsyplastic Hx of aks and isks  The patient presents for Total-Body Skin Exam (TBSE) for skin cancer screening and mole check. The patient has spots, moles and lesions to be evaluated, some may be new or changing and the patient may have concern these could be cancer.  The following portions of the chart were reviewed this encounter and updated as appropriate: medications, allergies, medical history  Review of Systems:  No other skin or systemic complaints except as noted in HPI or Assessment and Plan.  Objective  Well appearing patient in no apparent distress; mood and affect are within normal limits.  A full examination was performed including scalp, head, eyes, ears, nose, lips, neck, chest, axillae, abdomen, back, buttocks, bilateral upper extremities, bilateral lower extremities, hands, feet, fingers, toes, fingernails, and toenails. All findings within normal limits unless otherwise noted below.   Relevant physical exam findings are noted in the Assessment and Plan.  back x 3  , right chest x 1 (4) Erythematous stuck-on, waxy papule or plaque face x 8 (8) Erythematous thin papules/macules with gritty scale.  right distal dorsum foot 0.7 cm brown papule    Assessment & Plan   HISTORY OF MALIGNANT MELANOMA OF SKIN OF RIGHT LEG  - 12/11/2023 RIGHT PROXIMAL MEDIAL CALF  - Pt had Lymphoscintigraphy and Sentinel Lymph node biopsy and Wide local excision 01/08/2024 with Dr Tommas  [facility]. REGIONAL LYMPH NODES  Regional Lymph Node Status All regional lymph nodes negative for tumor   -  [place] Testing on Melanoma tissue from original biopsy showed Class IA lowest risk of metastasis within 5 years  - Patient will follow up with NP with Dr. Kermitt office in January 2026 - No evidence of  recurrence today - No lymphadenopathy - Recommend regular full body skin exams - Recommend daily broad spectrum sunscreen SPF 30+ to sun-exposed areas, reapply every 2 hours as needed.  - Call if any new or changing lesions are noted between office visits   HISTORY OF BASAL CELL CARCINOMA OF THE SKIN  11/15/2022 left zygoma anterior / superior to previous site - Mohs 04/11/2023   05/10/2021 BCC with Dysplastic nevus (mod to severe) right midline sternum superior - [facility]x [facility] 05/10/2021  and  Left medial popliteal - tx with [facility]   01/13/2021 left cheek and right of midline sternum all trx with ED&C  - Recommend regular full body skin exams - Recommend daily broad spectrum sunscreen SPF 30+ to sun-exposed areas, reapply every 2 hours as needed.  - Call if any new or changing lesions are noted between office visits  HISTORY OF DYSPLASTIC NEVUS 12/11/2023 Left middle to proximal medial calf - mild atypia   05/10/2021 BCC with Dysplastic nevus (mod to severe) right midline sternum superior - trx with [facility] 05/10/2021  08/27/2008 left upper back medial to mid scapula - moderate  No evidence of recurrence today Recommend regular full body skin exams Recommend daily broad spectrum sunscreen SPF 30+ to sun-exposed areas, reapply every 2 hours as needed.  Call if any new or changing lesions are noted between office visits   SKIN CANCER SCREENING PERFORMED TODAY.  ACTINIC DAMAGE - Chronic condition, secondary to cumulative UV/sun exposure - diffuse scaly erythematous macules with underlying dyspigmentation - Recommend daily broad spectrum  sunscreen SPF 30+ to sun-exposed areas, reapply every 2 hours as needed.  - Staying in the shade or wearing long sleeves, sun glasses (UVA+UVB protection) and wide brim hats (4-inch brim around the entire circumference of the hat) are also recommended for sun protection.  - Call for new or changing lesions.  LENTIGINES, SEBORRHEIC KERATOSES, HEMANGIOMAS - Benign  normal skin lesions - Benign-appearing - Call for any changes  MELANOCYTIC NEVI - mole at right foot  - Tan-brown and/or pink-flesh-colored symmetric macules and papules - Benign appearing on exam today - Observation - Call clinic for new or changing moles - Recommend daily use of broad spectrum spf 30+ sunscreen to sun-exposed areas.   Acrochordons (Skin Tags) - Fleshy, skin-colored pedunculated papules - Benign appearing.  - Observe. - If desired, they can be removed with an in office procedure that is not covered by insurance. - Please call the clinic if you notice any new or changing lesions.  INFLAMED SEBORRHEIC KERATOSIS (4) back x 3  , right chest x 1 (4) Symptomatic, irritating, patient would like treated. Destruction of lesion - back x 3  , right chest x 1 (4) Complexity: simple   Destruction method: cryotherapy   Informed consent: discussed and consent obtained   Timeout:  patient name, date of birth, surgical site, and procedure verified Lesion destroyed using liquid nitrogen: Yes   Region frozen until ice ball extended beyond lesion: Yes   Outcome: patient tolerated procedure well with no complications   Post-procedure details: wound care instructions given    ACTINIC KERATOSIS (8) face x 8 (8) Actinic keratoses are precancerous spots that appear secondary to cumulative UV radiation exposure/sun exposure over time. They are chronic with expected duration over 1 year. A portion of actinic keratoses will progress to squamous cell carcinoma of the skin. It is not possible to reliably predict which spots will progress to skin cancer and so treatment is recommended to prevent development of skin cancer.  Recommend daily broad spectrum sunscreen SPF 30+ to sun-exposed areas, reapply every 2 hours as needed.  Recommend staying in the shade or wearing long sleeves, sun glasses (UVA+UVB protection) and wide brim hats (4-inch brim around the entire circumference of the  hat). Call for new or changing lesions. Destruction of lesion - face x 8 (8) Complexity: simple   Destruction method: cryotherapy   Informed consent: discussed and consent obtained   Timeout:  patient name, date of birth, surgical site, and procedure verified Lesion destroyed using liquid nitrogen: Yes   Region frozen until ice ball extended beyond lesion: Yes   Outcome: patient tolerated procedure well with no complications   Post-procedure details: wound care instructions given    NEOPLASM OF UNCERTAIN BEHAVIOR right distal dorsum foot Epidermal / dermal shaving  Lesion diameter (cm):  0.7 Informed consent: discussed and consent obtained   Timeout: patient name, date of birth, surgical site, and procedure verified   Procedure prep:  Patient was prepped and draped in usual sterile fashion Prep type:  Isopropyl alcohol Anesthesia: the lesion was anesthetized in a standard fashion   Anesthetic:  1% lidocaine  w/ epinephrine  1-100,000 buffered w/ 8.4% NaHCO3 Instrument used: flexible razor blade   Hemostasis achieved with: pressure, aluminum chloride and electrodesiccation   Outcome: patient tolerated procedure well   Post-procedure details: sterile dressing applied and wound care instructions given   Dressing type: bandage and petrolatum    Specimen 1 - Surgical pathology Differential Diagnosis: nevus r/o dysplasia   Check Margins: yes  HISTORY OF MALIGNANT MELANOMA   HISTORY OF BASAL CELL CARCINOMA   HISTORY OF DYSPLASTIC NEVUS   ACTINIC SKIN DAMAGE   SKIN CANCER SCREENING   SKIN TAGS, MULTIPLE ACQUIRED   SEBORRHEIC KERATOSIS   LENTIGO   MELANOCYTIC NEVUS, UNSPECIFIED LOCATION   COUNSELING AND COORDINATION OF CARE   HEMANGIOMA OF SKIN   Return in about 5 months (around 11/08/2024) for TBSE hx of malignant melanoma .  IEleanor Blush, CMA, am acting as scribe for Alm Rhyme, MD.   Documentation: I have reviewed the above documentation for  accuracy and completeness, and I agree with the above.  Alm Rhyme, MD

## 2024-06-11 ENCOUNTER — Encounter: Payer: Self-pay | Admitting: Dermatology

## 2024-06-16 ENCOUNTER — Encounter: Payer: Self-pay | Admitting: Dermatology

## 2024-06-16 ENCOUNTER — Ambulatory Visit: Payer: Self-pay | Admitting: Dermatology

## 2024-06-16 LAB — SURGICAL PATHOLOGY

## 2024-06-17 ENCOUNTER — Encounter: Payer: Self-pay | Admitting: Dermatology

## 2024-06-17 NOTE — Telephone Encounter (Addendum)
 Tried calling patient regarding results. No answer. Lm for patient to return call.   ----- Message from Alm Rhyme sent at 06/16/2024  5:50 PM EST ----- FINAL DIAGNOSIS        1. Skin, right distal dorsum foot :       DYSPLASTIC COMPOUND NEVUS WITH MODERATE ATYPIA, PERIPHERAL AND DEEP MARGINS       INVOLVED   Moderate Dysplastic Recheck next visit ----- Message ----- From: Interface, Lab In Three Zero One Sent: 06/16/2024   4:41 PM EST To: Alm JAYSON Rhyme, MD

## 2024-06-17 NOTE — Telephone Encounter (Addendum)
 Patient returned call. Discussed bx results. No answer. Lm for patient return call. ----- Message from Alm Rhyme sent at 06/16/2024  5:50 PM EST ----- FINAL DIAGNOSIS        1. Skin, right distal dorsum foot :       DYSPLASTIC COMPOUND NEVUS WITH MODERATE ATYPIA, PERIPHERAL AND DEEP MARGINS       INVOLVED   Moderate Dysplastic Recheck next visit ----- Message ----- From: Interface, Lab In Three Zero One Sent: 06/16/2024   4:41 PM EST To: Alm JAYSON Rhyme, MD

## 2024-06-24 ENCOUNTER — Encounter (INDEPENDENT_AMBULATORY_CARE_PROVIDER_SITE_OTHER): Payer: BC Managed Care – PPO | Admitting: Ophthalmology

## 2024-06-24 DIAGNOSIS — I10 Essential (primary) hypertension: Secondary | ICD-10-CM

## 2024-06-24 DIAGNOSIS — H338 Other retinal detachments: Secondary | ICD-10-CM | POA: Diagnosis not present

## 2024-06-24 DIAGNOSIS — H35373 Puckering of macula, bilateral: Secondary | ICD-10-CM | POA: Diagnosis not present

## 2024-06-24 DIAGNOSIS — H35033 Hypertensive retinopathy, bilateral: Secondary | ICD-10-CM | POA: Diagnosis not present

## 2024-06-24 DIAGNOSIS — H43813 Vitreous degeneration, bilateral: Secondary | ICD-10-CM | POA: Diagnosis not present

## 2024-11-18 ENCOUNTER — Ambulatory Visit: Admitting: Dermatology

## 2024-12-23 ENCOUNTER — Ambulatory Visit: Admitting: Dermatology

## 2025-01-01 ENCOUNTER — Ambulatory Visit: Admitting: Dermatology

## 2025-06-30 ENCOUNTER — Encounter (INDEPENDENT_AMBULATORY_CARE_PROVIDER_SITE_OTHER): Admitting: Ophthalmology
